# Patient Record
Sex: Female | Born: 1985 | Race: Black or African American | Hispanic: No | Marital: Married | Smoking: Never smoker
Health system: Southern US, Community
[De-identification: ages and names within clinical notes are randomized; demographics above are authoritative.]

## PROBLEM LIST (undated history)

## (undated) ENCOUNTER — Inpatient Hospital Stay (HOSPITAL_COMMUNITY): Payer: Self-pay

## (undated) DIAGNOSIS — Z789 Other specified health status: Secondary | ICD-10-CM

## (undated) HISTORY — PX: NO PAST SURGERIES: SHX2092

---

## 2016-01-04 NOTE — L&D Delivery Note (Signed)
31 y.o. [id number] at 6872w4d delivered a viable female infant in cephalic, LOA position. Anterior shoulder delivered with ease. 60 sec delayed cord clamping. Cord clamped x2 and cut. Placenta delivered spontaneously intact, with 3VC. Fundus firm on exam with massage and pitocin. Good hemostasis noted.  Anesthesia: None Laceration: 1st degree perineal Suture: 2.0 Vicryl Good hemostasis noted. EBL: 250cc  Mom and baby recovering in LDR.    Apgars: APGAR (1 MIN): 9   APGAR (5 MINS): 9     Weight: Pending skin to skin  Sponge and instrument count were correct x2. Placenta sent to L&D.  Howard PouchLauren Feng, MD PGY-2 Family Medicine 07/09/2016, 1:09 AM

## 2016-02-01 ENCOUNTER — Other Ambulatory Visit (HOSPITAL_COMMUNITY): Payer: Self-pay | Admitting: Nurse Practitioner

## 2016-02-01 DIAGNOSIS — Z3689 Encounter for other specified antenatal screening: Secondary | ICD-10-CM

## 2016-02-02 ENCOUNTER — Other Ambulatory Visit: Payer: Self-pay

## 2016-02-12 ENCOUNTER — Ambulatory Visit (HOSPITAL_COMMUNITY)
Admission: RE | Admit: 2016-02-12 | Discharge: 2016-02-12 | Disposition: A | Discharge status: Home / Self Care | Payer: Medicaid Other | Source: Ambulatory Visit | Attending: Nurse Practitioner | Admitting: Nurse Practitioner

## 2016-02-12 ENCOUNTER — Encounter (HOSPITAL_COMMUNITY): Payer: Self-pay

## 2016-02-12 VITALS — BP 118/69 | HR 88 | Wt 170.8 lb

## 2016-02-12 DIAGNOSIS — Z3A19 19 weeks gestation of pregnancy: Secondary | ICD-10-CM | POA: Insufficient documentation

## 2016-02-12 DIAGNOSIS — Z3689 Encounter for other specified antenatal screening: Secondary | ICD-10-CM

## 2016-02-12 DIAGNOSIS — Z0489 Encounter for examination and observation for other specified reasons: Secondary | ICD-10-CM

## 2016-02-12 DIAGNOSIS — Z363 Encounter for antenatal screening for malformations: Secondary | ICD-10-CM | POA: Insufficient documentation

## 2016-02-12 DIAGNOSIS — Z843 Family history of consanguinity: Secondary | ICD-10-CM | POA: Insufficient documentation

## 2016-02-12 DIAGNOSIS — Z315 Encounter for genetic counseling: Secondary | ICD-10-CM | POA: Insufficient documentation

## 2016-02-12 DIAGNOSIS — O352XX0 Maternal care for (suspected) hereditary disease in fetus, not applicable or unspecified: Secondary | ICD-10-CM | POA: Insufficient documentation

## 2016-02-12 DIAGNOSIS — IMO0002 Reserved for concepts with insufficient information to code with codable children: Secondary | ICD-10-CM

## 2016-02-12 HISTORY — DX: Other specified health status: Z78.9

## 2016-02-12 NOTE — Addendum Note (Signed)
Encounter addended by: Heidi DachMelanie A Robb, RN on: 02/12/2016 11:04 AM<BR>    Actions taken: Visit diagnoses modified, Order list changed, Diagnosis association updated

## 2016-02-12 NOTE — Progress Notes (Signed)
Genetic Counseling  Visit Summary Note  Appointment Date: 02/12/2016 Referred By: Jolaine Click  Date of Birth: October 10, 1985  Pregnancy history: G4P3003 Estimated Date of Delivery: 07/05/16 Estimated Gestational Age: [redacted]w[redacted]d  [name] was seen for genetic counseling because of a paternal age of 89  In person English/Arabic translation was provided by a medical interpreter.  In summary:  Discussed paternal age related risks  New dominant gene conditions  Declined Vistara  Aneuploidy  Declined Panorama  Autistic spectrum disorders  Reviewed family history concerns  Couple are first cousins once removed  Declined routine carrier screening  Declined expanded carrier screening  [name] was counseled that advanced paternal age (APA) is defined as paternal age greater than or equal to age 32  Recent large-scale sequencing studies have shown that approximately 80% of de novo point mutations are of paternal origin.  Many studies have demonstrated a strong correlation between increased paternal age and de novo point mutations.  Although no specific data is available regarding fetal risks for fathers 477years old or older at conception, it is apparent that the overall risk for single gene conditions is increased.  It is estimated that the overall chance for a de novo mutation is approximately 1 in 200.  We discussed the wide range of conditions which can be caused by new dominant gene mutations    They were counseled that diagnostic testing for each individual single gene condition is not warranted or available unless ultrasound or concerns lend suspicion to a specific condition. However, there is another NIPS platform ([facility] through [facility] that is able to assess for specific mutations in a panel of 30 selected genes covering 26 conditions. Most of these conditions follow an autosomal dominant pattern of inheritance and typically occur due to de novo gene mutations. The detection  rates for these conditions vary depending upon the specific condition but range from 43% to 96%. Therefore, this screening would not identify all new dominant gene mutations. The lab currently does not accept Medicaid, thus she would have to pay out of pocket for this screen.  She declined screening with Vistara.  She was counseled that some literature suggests that APA is also associated with an increase in risk for fetal aneuploidy.  While other literature does not support this, we discussed that a specific risk for aneuploidy other than that based on maternal age cannot be quantified.  For this reason, we discussed available screening and diagnostic options for fetal aneuploidy.  Specifically, we discussed the options of Quad screening and noninvasive prenatal screening (NIPS).  We discussed that NIPS utilizes cell free placental DNA found in the maternal circulation. This screen is not diagnostic for chromosome conditions, but can provide information regarding the presence or absence of extra fetal DNA for chromosomes 13, 18, 21, X and Y. Thus, it would not identify or rule out all genetic conditions.  After thoughtful consideration, [name] declined NIPS today.    Lastly, we discussed that newer literature suggests that the risk for autism spectrum disorders (ASD) may be increased in children born to fathers of APA.  We discussed that ASDs are among the most common neurodevelopmental disorders, with approximately 1 in 85 children meeting criteria for ASD.  Approximately 80% of individuals diagnosed are female.  There is strong evidence that genetic factors play a critical role in development of ASD.  While there have been recent advances in identifying specific genetic causes of ASD, there are still many individuals for whom the  etiology of the ASD is not known.  She understands that at this time there is no reliable, comprehensive genetic testing available for ASD.     [name]  reported that she  and her husband are related.  Specifically, they are first cousins once removed.  [name]'s maternal grandmother and her husband's father are siblings.  We discussed that children born to a consanguineous couple are at increased risk for genetic conditions.  This increase in risk is related to the possibility of both parents passing on a recessive gene. We explained that every person carries approximately 7-10 non-working genes that when received in a double dose results in a recessive genetic condition.  In general, unrelated couples have a relatively low risk of having a child with a recessive condition because the likelihood of both parents carrying the same non-working recessive gene is very low.  However, when a couple is related, they have inherited some of their genetic information from the same family member, which leads to an increased chance that they may carry the same recessive gene and have a child with a recessive condition.  For first cousin unions, the risk to have a child with a birth defect, mental retardation, or genetic condition is increased approximately 2-4% above the general population risk (3-5%).  We reviewed chromosomes, genes, and recessive inheritance in detail.   There are a myriad of genetic disorders that occur more frequently in specific ethnic groups, those which can be traced to particular geographic locations. We discussed that although these genetic disorders are much more prevalent in specific ethnic groups, as families are becoming increasingly multiracial and multicultural, these conditions can occur in anyone from any race or ethnicity. For this reason, we discussed the availability of ethnic specific genetic carrier screening, professional society (ACOG) recommended carrier testing, and pan-ethnic carrier screening.   We reviewed that ACOG currently recommends that all patients be offered carrier screening for cystic fibrosis, spinal muscular atrophy and  hemoglobinopathies. In addition, she was counseled that there are a variety of genetic screening laboratories that have pan-ethnic, or expanded, carrier screening panels, which evaluate carrier status for a wide range of genetic conditions. Some of these conditions are severe and actionable, but also rare; others occur more commonly, but are less severe. We discussed that testing options range from screening for a single condition to panels of more than 200 autosomal or X-linked genetic conditions. We reviewed that the prevalence of each condition varies (and often varies with ethnicity). Thus the couples' background risk to be a carrier for each of these various conditions would range, and in some cases be very low or unknown. Similarly, the detection rate varies with each condition and also varies in some cases with ethnicity, ranging from greater than 99% (in the case of hemoglobinopathies) to unknown. We reviewed that a negative carrier screen would thus reduce, but not eliminate the chance to be a carrier for these conditions. For some conditions included on specific pan-ethnic carrier screening panels, the pre-test carrier frequency and/or the detection rate is unknown. Thus, for some conditions, the exact reduction of risk with a negative carrier screening result may not be able to be quantified. We reviewed that in the event that one partner is found to be a carrier for one or more conditions, carrier screening would be available to the partner for those conditions. After thoughtful consideration of their options, [name] declined expanded carrier screening today.  Both family histories were reviewed and found to  be noncontributory for birth defects, intellectual disability, and known genetic conditions. Without further information regarding the provided family history, an accurate genetic risk cannot be calculated. Further genetic counseling is warranted if more information is obtained.  I counseled  Lindsey Gilbert, with the assistance of an Arabic interpreter, regarding the above risks and available options. The approximate face-to-face time with the genetic counselor was 45 minutes.  Cam Hai, MS,  Certified Genetic Counselor

## 2016-03-11 ENCOUNTER — Ambulatory Visit (HOSPITAL_COMMUNITY)
Admission: RE | Admit: 2016-03-11 | Discharge: 2016-03-11 | Disposition: A | Discharge status: Home / Self Care | Payer: Medicaid Other | Source: Ambulatory Visit | Attending: Nurse Practitioner | Admitting: Nurse Practitioner

## 2016-03-11 ENCOUNTER — Encounter (HOSPITAL_COMMUNITY): Payer: Self-pay

## 2016-03-11 DIAGNOSIS — Z362 Encounter for other antenatal screening follow-up: Secondary | ICD-10-CM | POA: Insufficient documentation

## 2016-03-11 DIAGNOSIS — IMO0002 Reserved for concepts with insufficient information to code with codable children: Secondary | ICD-10-CM

## 2016-03-11 DIAGNOSIS — Z3A23 23 weeks gestation of pregnancy: Secondary | ICD-10-CM | POA: Insufficient documentation

## 2016-03-11 DIAGNOSIS — Z0489 Encounter for examination and observation for other specified reasons: Secondary | ICD-10-CM

## 2016-07-07 ENCOUNTER — Other Ambulatory Visit (HOSPITAL_COMMUNITY): Payer: Self-pay | Admitting: Nurse Practitioner

## 2016-07-07 DIAGNOSIS — O480 Post-term pregnancy: Secondary | ICD-10-CM

## 2016-07-08 ENCOUNTER — Ambulatory Visit (HOSPITAL_COMMUNITY)
Admission: RE | Admit: 2016-07-08 | Discharge: 2016-07-08 | Disposition: A | Discharge status: Home / Self Care | Payer: Medicaid Other | Source: Ambulatory Visit | Attending: Nurse Practitioner | Admitting: Nurse Practitioner

## 2016-07-08 ENCOUNTER — Inpatient Hospital Stay (HOSPITAL_COMMUNITY)
Admission: AD | Admit: 2016-07-08 | Discharge: 2016-07-11 | DRG: 775 | Disposition: A | Discharge status: Home / Self Care | Payer: Medicaid Other | Source: Ambulatory Visit | Attending: Obstetrics and Gynecology | Admitting: Obstetrics and Gynecology

## 2016-07-08 ENCOUNTER — Other Ambulatory Visit (HOSPITAL_COMMUNITY): Payer: Self-pay | Admitting: Nurse Practitioner

## 2016-07-08 DIAGNOSIS — Z3A40 40 weeks gestation of pregnancy: Secondary | ICD-10-CM

## 2016-07-08 DIAGNOSIS — O480 Post-term pregnancy: Secondary | ICD-10-CM

## 2016-07-08 DIAGNOSIS — Z880 Allergy status to penicillin: Secondary | ICD-10-CM

## 2016-07-08 DIAGNOSIS — Z6791 Unspecified blood type, Rh negative: Secondary | ICD-10-CM

## 2016-07-08 DIAGNOSIS — Z3490 Encounter for supervision of normal pregnancy, unspecified, unspecified trimester: Secondary | ICD-10-CM

## 2016-07-08 DIAGNOSIS — O26893 Other specified pregnancy related conditions, third trimester: Principal | ICD-10-CM | POA: Diagnosis present

## 2016-07-09 ENCOUNTER — Encounter (HOSPITAL_COMMUNITY): Payer: Self-pay | Admitting: Obstetrics and Gynecology

## 2016-07-09 DIAGNOSIS — Z3493 Encounter for supervision of normal pregnancy, unspecified, third trimester: Secondary | ICD-10-CM | POA: Diagnosis present

## 2016-07-09 DIAGNOSIS — O26893 Other specified pregnancy related conditions, third trimester: Secondary | ICD-10-CM | POA: Diagnosis present

## 2016-07-09 DIAGNOSIS — Z6791 Unspecified blood type, Rh negative: Secondary | ICD-10-CM | POA: Diagnosis not present

## 2016-07-09 DIAGNOSIS — Z3490 Encounter for supervision of normal pregnancy, unspecified, unspecified trimester: Secondary | ICD-10-CM

## 2016-07-09 DIAGNOSIS — Z3A40 40 weeks gestation of pregnancy: Secondary | ICD-10-CM

## 2016-07-09 DIAGNOSIS — Z880 Allergy status to penicillin: Secondary | ICD-10-CM | POA: Diagnosis not present

## 2016-07-09 LAB — CBC
HEMATOCRIT: 37.5 % (ref 36.0–46.0)
Hemoglobin: 13 g/dL (ref 12.0–15.0)
MCH: 27.8 pg (ref 26.0–34.0)
MCHC: 34.7 g/dL (ref 30.0–36.0)
MCV: 80.3 fL (ref 78.0–100.0)
Platelets: 171 10*3/uL (ref 150–400)
RBC: 4.67 MIL/uL (ref 3.87–5.11)
RDW: 15 % (ref 11.5–15.5)
WBC: 12.3 10*3/uL — ABNORMAL HIGH (ref 4.0–10.5)

## 2016-07-09 LAB — RPR: RPR Ser Ql: NONREACTIVE

## 2016-07-09 LAB — TYPE AND SCREEN
ABO/RH(D): B NEG
Antibody Screen: NEGATIVE

## 2016-07-09 MED ORDER — ONDANSETRON HCL 4 MG/2ML IJ SOLN: 4.0000 mg | INTRAMUSCULAR | Status: DC | PRN

## 2016-07-09 MED ORDER — COCONUT OIL OIL
1.0000 "application " | TOPICAL_OIL | Status: DC | PRN
  Filled 2016-07-09: qty 120

## 2016-07-09 MED ORDER — OXYCODONE HCL 5 MG PO TABS: 5.0000 mg | ORAL_TABLET | ORAL | Status: DC | PRN

## 2016-07-09 MED ORDER — ONDANSETRON HCL 4 MG/2ML IJ SOLN: 4.0000 mg | Freq: Four times a day (QID) | INTRAMUSCULAR | Status: DC | PRN

## 2016-07-09 MED ORDER — TETANUS-DIPHTH-ACELL PERTUSSIS 5-2.5-18.5 LF-MCG/0.5 IM SUSP: 0.5000 mL | Freq: Once | INTRAMUSCULAR | Status: DC

## 2016-07-09 MED ORDER — PRENATAL MULTIVITAMIN CH
1.0000 | ORAL_TABLET | Freq: Every day | ORAL | Status: DC
  Administered 2016-07-09 – 2016-07-10 (×2): 1 via ORAL
  Filled 2016-07-09 (×3): qty 1

## 2016-07-09 MED ORDER — SIMETHICONE 80 MG PO CHEW: 80.0000 mg | CHEWABLE_TABLET | ORAL | Status: DC | PRN

## 2016-07-09 MED ORDER — WITCH HAZEL-GLYCERIN EX PADS: 1.0000 "application " | MEDICATED_PAD | CUTANEOUS | Status: DC | PRN

## 2016-07-09 MED ORDER — LIDOCAINE HCL (PF) 1 % IJ SOLN
INTRAMUSCULAR | Status: AC
  Administered 2016-07-09: 30 mL
  Filled 2016-07-09: qty 30

## 2016-07-09 MED ORDER — SENNOSIDES-DOCUSATE SODIUM 8.6-50 MG PO TABS
2.0000 | ORAL_TABLET | ORAL | Status: DC
  Administered 2016-07-09 – 2016-07-10 (×2): 2 via ORAL
  Filled 2016-07-09 (×2): qty 2

## 2016-07-09 MED ORDER — OXYCODONE-ACETAMINOPHEN 5-325 MG PO TABS: 2.0000 | ORAL_TABLET | ORAL | Status: DC | PRN

## 2016-07-09 MED ORDER — ACETAMINOPHEN 325 MG PO TABS: 650.0000 mg | ORAL_TABLET | ORAL | Status: DC | PRN

## 2016-07-09 MED ORDER — ZOLPIDEM TARTRATE 5 MG PO TABS: 5.0000 mg | ORAL_TABLET | Freq: Every evening | ORAL | Status: DC | PRN

## 2016-07-09 MED ORDER — LIDOCAINE HCL 1 % IJ SOLN
10.0000 mL | Freq: Once | INTRAMUSCULAR | Status: DC
  Filled 2016-07-09: qty 10

## 2016-07-09 MED ORDER — OXYCODONE-ACETAMINOPHEN 5-325 MG PO TABS
1.0000 | ORAL_TABLET | ORAL | Status: DC | PRN
  Administered 2016-07-09: 1 via ORAL
  Filled 2016-07-09: qty 1

## 2016-07-09 MED ORDER — RHO D IMMUNE GLOBULIN 1500 UNIT/2ML IJ SOSY
300.0000 ug | PREFILLED_SYRINGE | Freq: Once | INTRAMUSCULAR | Status: AC
  Administered 2016-07-09: 300 ug via INTRAMUSCULAR
  Filled 2016-07-09: qty 2

## 2016-07-09 MED ORDER — IBUPROFEN 600 MG PO TABS
600.0000 mg | ORAL_TABLET | Freq: Four times a day (QID) | ORAL | Status: DC
  Administered 2016-07-09 – 2016-07-11 (×9): 600 mg via ORAL
  Filled 2016-07-09 (×10): qty 1

## 2016-07-09 MED ORDER — OXYTOCIN BOLUS FROM INFUSION: 500.0000 mL | Freq: Once | INTRAVENOUS | Status: DC

## 2016-07-09 MED ORDER — LACTATED RINGERS IV SOLN: INTRAVENOUS | Status: DC

## 2016-07-09 MED ORDER — OXYTOCIN 10 UNIT/ML IJ SOLN: 10.0000 [IU] | Freq: Once | INTRAMUSCULAR | Status: DC

## 2016-07-09 MED ORDER — LIDOCAINE HCL (PF) 1 % IJ SOLN
30.0000 mL | INTRAMUSCULAR | Status: DC | PRN
  Filled 2016-07-09: qty 30

## 2016-07-09 MED ORDER — BENZOCAINE-MENTHOL 20-0.5 % EX AERO
1.0000 "application " | INHALATION_SPRAY | CUTANEOUS | Status: DC | PRN
  Administered 2016-07-09: 1 via TOPICAL
  Filled 2016-07-09 (×2): qty 56

## 2016-07-09 MED ORDER — DIBUCAINE 1 % RE OINT
1.0000 "application " | TOPICAL_OINTMENT | RECTAL | Status: DC | PRN
  Filled 2016-07-09: qty 28

## 2016-07-09 MED ORDER — DIPHENHYDRAMINE HCL 25 MG PO CAPS: 25.0000 mg | ORAL_CAPSULE | Freq: Four times a day (QID) | ORAL | Status: DC | PRN

## 2016-07-09 MED ORDER — LACTATED RINGERS IV SOLN: 500.0000 mL | INTRAVENOUS | Status: DC | PRN

## 2016-07-09 MED ORDER — OXYTOCIN 10 UNIT/ML IJ SOLN
INTRAMUSCULAR | Status: AC
  Administered 2016-07-09: 10 [IU]
  Filled 2016-07-09: qty 1

## 2016-07-09 MED ORDER — ONDANSETRON HCL 4 MG PO TABS: 4.0000 mg | ORAL_TABLET | ORAL | Status: DC | PRN

## 2016-07-09 MED ORDER — SOD CITRATE-CITRIC ACID 500-334 MG/5ML PO SOLN: 30.0000 mL | ORAL | Status: DC | PRN

## 2016-07-09 MED ORDER — OXYTOCIN 40 UNITS IN LACTATED RINGERS INFUSION - SIMPLE MED: 2.5000 [IU]/h | INTRAVENOUS | Status: DC

## 2016-07-09 NOTE — MAU Note (Signed)
Leaking fld since 1900 and states is alittle yellow. Ctxs since 2000

## 2016-07-09 NOTE — Progress Notes (Signed)
Barrie DunkerMurayyah Johnson and I were called to assist in delivery in MAU. Arrived in patient's room at 0025 to assist.   Lanier EnsignSavannah Brendle RN

## 2016-07-09 NOTE — MAU Note (Signed)
BS called for bed and no clean bed available. Pt will deliver in MAU

## 2016-07-09 NOTE — Progress Notes (Signed)
Video interpreter for Arabic language was used to explain admission packet including fall precautions for mother and infant safety and security precautions.

## 2016-07-09 NOTE — Progress Notes (Signed)
Dr Mosetta PuttFeng/ Wynelle BourgeoisMarie Williams CNM notified of pt's admission and status. Aware pt complete and no beds in BS and will deliver in MAU

## 2016-07-09 NOTE — H&P (Signed)
LABOR AND DELIVERY ADMISSION HISTORY AND PHYSICAL NOTE  Lindsey Gilbert is a 31 y.o. female [phone] by LMP with IUP at 7845w4d presenting for SOL. She reports +FM, + contractions, No LOF, no VB, no blurry vision, headaches or peripheral edema, and RUQ pain.  She plans on breast feeding.  Patient was admitted and precipitously delivered in MAU. See separate delivery note for details.  Prenatal History/Complications:  Past Medical History: Past Medical History:  Diagnosis Date  . Medical history non-contributory     Past Surgical History: Past Surgical History:  Procedure Laterality Date  . NO PAST SURGERIES      Obstetrical History: OB History    Gravida Para Term Preterm AB Living   4 3 3     3    SAB TAB Ectopic Multiple Live Births                  Social History: Social History   Social History  . Marital status: Married    Spouse name: N/A  . Number of children: N/A  . Years of education: N/A   Social History Main Topics  . Smoking status: Never Smoker  . Smokeless tobacco: Never Used  . Alcohol use No  . Drug use: No  . Sexual activity: Not on file   Other Topics Concern  . Not on file   Social History Narrative  . No narrative on file    Family History: No family history on file.  Allergies: Allergies  Allergen Reactions  . Penicillins     Prescriptions Prior to Admission  Medication Sig Dispense Refill Last Dose  . Prenatal Vit-Fe Fumarate-FA (PRENATAL VITAMIN PO) Take by mouth.   Taking     Review of Systems   All systems reviewed and negative except as stated in HPI  Physical Exam Blood pressure 132/81, pulse 88, temperature 98.7 F (37.1 C), resp. rate 20, height 5' 4.5" (1.638 m), weight 83 kg (183 lb), last menstrual period 09/29/2015. General appearance: alert, cooperative and appears stated age  Prenatal labs: ABO, Rh:  B negative Antibody:  negative Rubella: Immune RPR:   negative HBsAg:   negative HIV:   NR GBS:   negative 1  hr Glucola: WNL Genetic screening:  normal Anatomy US: normal  Prenatal Transfer Tool  Maternal Diabetes: No Genetic Screening: Normal Maternal Ultrasounds/Referrals: Normal Fetal Ultrasounds or other Referrals:  None Maternal Substance Abuse:  No Significant Maternal Medications:  None Significant Maternal Lab Results: Lab values include: Group B Strep negative  No results found for this or any previous visit (from the past 24 hour(s)).  Patient Active Problem List   Diagnosis Date Noted  . [redacted] weeks gestation of pregnancy   . Hereditary disease in family possibly affecting fetus, affecting management of mother, antepartum condition or complication, not applicable or unspecified fetus   . Family history of consanguinity     Assessment: Lindsey Gilbert is a 31 y.o. G4P3003 at 2645w4d here for SOL. Patient was admitted and delivered in the MAU. She will be admitted to mother baby.  - continue normal postpartum care - rhogam  Howard PouchLauren Feng, MD PGY-2 [facility] 07/09/2016, 1:13 AM

## 2016-07-09 NOTE — Lactation Note (Signed)
This note was copied from a baby's chart. Lactation Consultation Note  Patient Name: Boy Dorna MaiBalsm Kerstein UJWJX'BToday's Date: 07/09/2016 Reason for consult: Initial assessment  Visited with this P5 experienced Mom, baby 8311 hrs old.  Baby latches well, latch score 9 given by RN.  Mom denies any problem or discomfort.  Baby given formula (4ml) by bottle.  Recommended she keep baby STS, and feed him at the breast often.  Explained to Mom about importance of breastfeeding to help her milk volume increase.  Mom taught to hand express milk, so encouraged her to do this prior to latching baby. Explained about IP and OP lactation services.  Encouraged Mom to call for assistance prn. Lactation to follow up in am.  Consult Status Consult Status: Follow-up Date: 07/10/16 Follow-up type: In-patient    Judee ClaraSmith, Tryce Surratt E 07/09/2016, 11:52 AM

## 2016-07-10 LAB — RH IG WORKUP (INCLUDES ABO/RH)
ABO/RH(D): B NEG
Fetal Screen: NEGATIVE
GESTATIONAL AGE(WKS): 40.4
UNIT DIVISION: 0

## 2016-07-10 LAB — CBC
HCT: 36 % (ref 36.0–46.0)
Hemoglobin: 12.2 g/dL (ref 12.0–15.0)
MCH: 28 pg (ref 26.0–34.0)
MCHC: 33.9 g/dL (ref 30.0–36.0)
MCV: 82.6 fL (ref 78.0–100.0)
PLATELETS: 152 10*3/uL (ref 150–400)
RBC: 4.36 MIL/uL (ref 3.87–5.11)
RDW: 15.2 % (ref 11.5–15.5)
WBC: 8.6 10*3/uL (ref 4.0–10.5)

## 2016-07-10 MED ORDER — DOCUSATE SODIUM 100 MG PO CAPS: 100.0000 mg | ORAL_CAPSULE | Freq: Two times a day (BID) | ORAL | Status: DC | PRN

## 2016-07-10 MED ORDER — BISACODYL 10 MG RE SUPP
10.0000 mg | Freq: Every day | RECTAL | Status: DC | PRN
  Administered 2016-07-10: 10 mg via RECTAL
  Filled 2016-07-10: qty 1

## 2016-07-10 NOTE — Progress Notes (Signed)
Post Partum Day 1 Subjective: Due to language barrier, an Arabic audio interpreter was present during the history-taking and subsequent discussion (and for part of the physical exam) with this patient.  Patient is up ad lib, voiding, tolerating PO, + flatus and but reports constipation and wants stool softeners.  Baby boy is doing well. Breastfeeding. Declines inpatient circumcision.  Objective: Blood pressure 112/70, pulse 63, temperature 98.7 F (37.1 C), temperature source Oral, resp. rate 18, height 5' 4.5" (1.638 m), weight 183 lb (83 kg), last menstrual period 09/29/2015, SpO2 100 %, unknown if currently breastfeeding.  Physical Exam:  General: alert and no distress Lochia: appropriate Uterine Fundus: firm DVT Evaluation: No evidence of DVT seen on physical exam. Negative Homan's sign.   Recent Labs  07/09/16 0302 07/10/16 0617  HGB 13.0 12.2  HCT 37.5 36.0    Assessment/Plan: Colace and Dulcolax ordered prn Plan for discharge tomorrow and Contraception IUD   LOS: 1 day   Jaynie CollinsUgonna Kainoah Bartosiewicz, MD 07/10/2016, 9:14 AM

## 2016-07-10 NOTE — Lactation Note (Addendum)
This note was copied from a baby's chart. Lactation Consultation Note  Patient Name: Lindsey Gilbert ZOXWR'UToday's Date: 07/10/2016 Reason for consult: Follow-up assessment  Follow up visit at 40 hours of age.  Mom feeding baby now in side lying position with intermittent sucking and chomping.  Lc removed baby from breast and instructed mom on how to as well.  Baby prefers shallow latch and mom doesn't have as much control in this position.  Mom has large everted nipple with compressible tissue.  Baby latched well with wide gape and strong sucking and then tires at breast.  Mom gave baby a bottle of 30mls about 1 hours previous to feeding.  LC instructed mom on waiting for wide open mouth, holding baby close and compressing breasts.  Baby has several swallows, but to sleepy to maintain feeding on his own.  With stimulation baby nursed for about 10 minutes.  Mom reports minimal soreness with latch on  Improved with depth of latch.   LC offered to set up DEBP to increase milk supply due to supplementing with formula and only small smeared stool in 40 hours of life. Mom reports she has not used a pump with older children, but she wants to now to help with milk supply.  LC unsure how well baby is consistently transferring at breast during feedings and encouraged mom to breast feed baby first before supplementing with formula and then post pump on initiate phase.   LC increased flange size to #27 with improved fit and comfort.   Mom to call for assist as needed.     Maternal Data Has patient been taught Hand Expression?: Yes Does the patient have breastfeeding experience prior to this delivery?: Yes  Feeding Feeding Type: Breast Fed Length of feed:  (few minutes)  LATCH Score/Interventions Latch: Repeated attempts needed to sustain latch, nipple held in mouth throughout feeding, stimulation needed to elicit sucking reflex. Intervention(s): Adjust position;Assist with latch;Breast massage;Breast  compression  Audible Swallowing: A few with stimulation Intervention(s): Skin to skin;Hand expression;Alternate breast massage  Type of Nipple: Everted at rest and after stimulation  Comfort (Breast/Nipple): Soft / non-tender  Problem noted: Mild/Moderate discomfort  Hold (Positioning): Assistance needed to correctly position infant at breast and maintain latch. Intervention(s): Breastfeeding basics reviewed;Position options  LATCH Score: 7  Lactation Tools Discussed/Used WIC Program: Yes Pump Review: Setup, frequency, and cleaning;Milk Storage Initiated by:: JS IBCLC Date initiated:: 07/10/16   Consult Status Consult Status: Follow-up Date: 07/11/16 Follow-up type: In-patient    Jannifer RodneyShoptaw, Jana Lynn 07/10/2016, 4:52 PM

## 2016-07-11 MED ORDER — IBUPROFEN 600 MG PO TABS: 600.0000 mg | ORAL_TABLET | Freq: Four times a day (QID) | ORAL | 0 refills | Status: DC | PRN

## 2016-07-11 NOTE — Discharge Summary (Signed)
OB Discharge Summary     Patient Name: Lindsey Gilbert DOB: March 18, 1985 MRN: [id number]  Date of admission: 07/08/2016 Delivering MD: Howard Pouch   Date of discharge: 07/11/2016  Admitting diagnosis: 40wks contractions water broke Intrauterine pregnancy: [redacted]w[redacted]d     Secondary diagnosis:  Active Problems:   Pregnant   Normal labor and delivery  Additional problems: Rh negative     Discharge diagnosis: Term Pregnancy Delivered                                                                                                Post partum procedures:rhogam  Augmentation: N/A  Complications: None  Hospital course:  Onset of Labor With Vaginal Delivery     31 y.o. yo [id number] at [redacted]w[redacted]d was admitted in Active Labor on 07/08/2016. Patient had an uncomplicated labor course as follows:  Membrane Rupture Time/Date:   ,    Intrapartum Procedures: Episiotomy: None [1]                                         Lacerations:  1st degree [2]  Patient had a delivery of a Viable infant. 07/09/2016  Information for the patient's newborn:  Lindsey Gilbert [[id number]]  Delivery Method: Vaginal, Spontaneous Delivery (Filed from Delivery Summary)    Pateint had an uncomplicated postpartum course.  She is ambulating, tolerating a regular diet, passing flatus, and urinating well. Patient is discharged home in stable condition on 07/11/16.   Physical exam  Vitals:   07/09/16 1831 07/10/16 0615 07/10/16 1910 07/11/16 0529  BP: 114/64 112/70 99/60 116/73  Pulse: 64 63 (!) 59 69  Resp: 18 18 16 16   Temp: 98.3 F (36.8 C) 98.7 F (37.1 C) 98.2 F (36.8 C) 98.2 F (36.8 C)  TempSrc: Oral Oral Oral Oral  SpO2:      Weight:      Height:       General: alert and cooperative Lochia: appropriate Uterine Fundus: firm Incision: N/A DVT Evaluation: No evidence of DVT seen on physical exam. Labs: Lab Results  Component Value Date   WBC 8.6 07/10/2016   HGB 12.2 07/10/2016   HCT 36.0 07/10/2016   MCV  82.6 07/10/2016   PLT 152 07/10/2016   No flowsheet data found.  Discharge instruction: per After Visit Summary and "Baby and Me Booklet".  After visit meds:  Allergies as of 07/11/2016      Reactions   Penicillins Rash   Has patient had a PCN reaction causing immediate rash, facial/tongue/throat swelling, SOB or lightheadedness with hypotension: Yes Has patient had a PCN reaction causing severe rash involving mucus membranes or skin necrosis: No Has patient had a PCN reaction that required hospitalization: No Has patient had a PCN reaction occurring within the last 10 years: No If all of the above answers are "NO", then may proceed with Cephalosporin use.      Medication List    TAKE these medications   ibuprofen 600 MG tablet Commonly known as:  ADVIL,MOTRIN Take 1 tablet (600 mg total) by mouth every 6 (six) hours as needed.   PRENATAL VITAMIN PO Take 1 tablet by mouth daily.       Diet: routine diet  Activity: Advance as tolerated. Pelvic rest for 6 weeks.   Outpatient follow up:6 weeks Follow up Appt:No future appointments. Follow up Visit:No Follow-up on file.  Postpartum contraception: IUD Mirena  Newborn Data: Live born female  Birth Weight: 8 lb 0.4 oz (3640 g) APGAR: 9, 9  Baby Feeding: Breast Disposition:home with mother   07/11/2016 [name], Lindsey Canion, CNM  7:16 AM

## 2016-07-11 NOTE — Lactation Note (Signed)
This note was copied from a baby's chart. Lactation Consultation Note  [place] video interpreter [phone] use for Arabic. Mother states baby latches well but is giving lots of formula. She states she is also pumping and getting breastmilk. Discussed supply and demand and encouraged mother to breastfeed before offering formula to help establish her milk supply. Mom encouraged to feed baby 8-12 times/24 hours and with feeding cues.  Reviewed engorgement care and monitoring voids/stools. Provided mother with hand pump and demonstrated use.   Patient Name: Lindsey Gilbert EAVWU'JToday's Date: 07/11/2016 Reason for consult: Follow-up assessment   Maternal Data    Feeding    LATCH Score/Interventions                      Lactation Tools Discussed/Used     Consult Status Consult Status: Complete    Hardie PulleyBerkelhammer, Ruth Boschen 07/11/2016, 10:28 AM

## 2016-07-11 NOTE — Discharge Instructions (Signed)

## 2016-07-11 NOTE — Progress Notes (Signed)
UR chart review completed.  

## 2016-07-12 ENCOUNTER — Inpatient Hospital Stay (HOSPITAL_COMMUNITY): Payer: Medicaid Other

## 2017-01-03 NOTE — L&D Delivery Note (Addendum)
Delivery Note Lindsey Gilbert is a 32 y.o. female G5P4004 with IUP at 6727w2d admitted for SOL.  She arrived to MAU complete and pushing without augmentation.  She pushed twice to deliver.  Cord clamping delayed by several minutes then clamped by S-CNM and cut by FOB.  At 9:19 AM a viable female was delivered via Vaginal, Spontaneous (Presentation: vertex, ROA).  APGAR: 7, 9; weight pending.   Placenta status: spontaneous, intact. Brisk bleeding noted, pitocin and methergine given. Cord: 3-vessel, intact, with two true knots and no complications. Cord pH: n/a  Anesthesia: Local Episiotomy: None Lacerations: 2nd degree Suture Repair: 2.0 vicryl rapide Est. Blood Loss (mL): 600   Mom to postpartum.  Baby to Couplet care / Skin to Skin.  Bernerd LimboJamilla R Gilbert 11/15/2017, 10:10 AM  Midwife attestation: I was gloved and present for delivery in its entirety and I agree with the above resident's note.  Donette LarryMelanie Everlene Gilbert, CNM 10:27 AM

## 2017-06-08 ENCOUNTER — Other Ambulatory Visit (HOSPITAL_COMMUNITY): Payer: Self-pay | Admitting: Nurse Practitioner

## 2017-06-08 DIAGNOSIS — Z3A19 19 weeks gestation of pregnancy: Secondary | ICD-10-CM

## 2017-06-08 DIAGNOSIS — Z36 Encounter for antenatal screening for chromosomal anomalies: Secondary | ICD-10-CM

## 2017-06-08 DIAGNOSIS — Z3689 Encounter for other specified antenatal screening: Secondary | ICD-10-CM

## 2017-06-14 ENCOUNTER — Encounter (HOSPITAL_COMMUNITY): Payer: Self-pay | Admitting: *Deleted

## 2017-06-15 ENCOUNTER — Encounter (HOSPITAL_COMMUNITY): Payer: Self-pay

## 2017-06-15 ENCOUNTER — Ambulatory Visit (HOSPITAL_COMMUNITY)
Admission: RE | Admit: 2017-06-15 | Discharge: 2017-06-15 | Disposition: A | Discharge status: Home / Self Care | Payer: Medicaid Other | Source: Ambulatory Visit | Attending: Nurse Practitioner | Admitting: Nurse Practitioner

## 2017-06-15 ENCOUNTER — Other Ambulatory Visit (HOSPITAL_COMMUNITY): Payer: Self-pay | Admitting: Nurse Practitioner

## 2017-06-15 ENCOUNTER — Other Ambulatory Visit (HOSPITAL_COMMUNITY): Payer: Self-pay | Admitting: *Deleted

## 2017-06-15 DIAGNOSIS — Z3A19 19 weeks gestation of pregnancy: Secondary | ICD-10-CM | POA: Diagnosis not present

## 2017-06-15 DIAGNOSIS — Z3689 Encounter for other specified antenatal screening: Secondary | ICD-10-CM | POA: Diagnosis not present

## 2017-06-15 DIAGNOSIS — Z315 Encounter for genetic counseling: Secondary | ICD-10-CM | POA: Diagnosis not present

## 2017-06-15 DIAGNOSIS — Z843 Family history of consanguinity: Secondary | ICD-10-CM | POA: Diagnosis not present

## 2017-06-15 DIAGNOSIS — O09892 Supervision of other high risk pregnancies, second trimester: Secondary | ICD-10-CM

## 2017-06-15 DIAGNOSIS — O09529 Supervision of elderly multigravida, unspecified trimester: Secondary | ICD-10-CM

## 2017-06-15 DIAGNOSIS — Z36 Encounter for antenatal screening for chromosomal anomalies: Secondary | ICD-10-CM

## 2017-06-15 NOTE — Progress Notes (Signed)
Genetic Counseling  High-Risk Gestation Note  Appointment Date:  06/15/2017 Referred By: Jolaine Click, NP Date of Birth:  [birth year] Partner:  Francie Massing   Pregnancy History: [id number] Estimated Date of Delivery: 11/13/17 Estimated Gestational Age: 46w3dAttending: MRenella Cunas MD  I met with [name] for genetic counseling because of a family history of consanguinity and advanced paternal age for the father of the pregnancy. [name] was seen in her previous pregnancy on 02/12/16 for genetic counseling. AUticawas present and provided interpretation for today's visit.   In summary:  Discussed family history of consanguinity  Increased risk for autosomal recessive conditions in offspring  Risk for couple to be carrier couple is reduced given their four previous unaffected children  Discussed option of pan-ethnic expanded carrier screening- declined  Reviewed advanced paternal age and associated risk for de novo single gene conditions  Discussed options of screening / testing  Detailed ultrasound- performed today, see separate report  Single gene NIPT ([facility])- patient declined  We began by updating the family history in detail from the patient's 2018 visit. Updates were noncontributory for birth defects, intellectual disability, and known genetic conditions. The couple are [place] and [name] reported that their grandparents are siblings; her maternal grandmother and her husband's maternal grandfather were siblings, meaning the couple are second cousins to each other.   We discussed that children born to a consanguineous couple are at increased risk for genetic conditions.  This increase in risk is related to the possibility of both parents passing on a recessive gene. We explained that every person carries approximately 7-10 non-working genes that when received in a double dose results in a recessive genetic condition.  In general,  unrelated couples have a relatively low risk of having a child with a recessive condition because the likelihood of both parents carrying the same non-working recessive gene is very low.  However, when a couple is related, they have inherited some of their genetic information from the same family member, which leads to an increased chance that they may carry the same recessive gene and have a child with a recessive condition.  For second cousin unions, the risk to have a child with a birth defect, intellectual disability, or genetic condition is increased approximately 1% above the general population risk of 3-5%.  We reviewed chromosomes, genes, and recessive inheritance in detail.   There are a myriad of genetic disorders that occur more frequently in specific ethnic groups, those which can be traced to particular geographic locations. We discussed that although these genetic disorders are much more prevalent in specific ethnic groups, as families are becoming increasingly multiracial and multicultural, these conditions can occur in anyone from any race or ethnicity. For this reason, we discussed the availability of ethnic specific genetic carrier screening, professional society (ACOG) recommended carrier testing, and pan-ethnic carrier screening.   We reviewed that ACOG currently recommends that all patients be offered carrier screening for cystic fibrosis, spinal muscular atrophy and hemoglobinopathies. In addition, they were counseled that there are a variety of genetic screening laboratories that have pan-ethnic, or expanded, carrier screening panels, which evaluate carrier status for a wide range of genetic conditions. Some of these conditions are severe and actionable, but also rare; others occur more commonly, but are less severe. We discussed that testing options range from screening for a single condition to panels of more than 200 autosomal or X-linked genetic conditions. We reviewed that the  prevalence of each  condition varies (and often varies with ethnicity). Thus the couples' background risk to be a carrier for each of these various conditions would range, and in some cases be very low or unknown. Similarly, the detection rate varies with each condition and also varies in some cases with ethnicity, ranging from greater than 99% (in the case of hemoglobinopathies) to unknown. We reviewed that a negative carrier screen would thus reduce, but not eliminate the chance to be a carrier for these conditions. For some conditions included on specific pan-ethnic carrier screening panels, the pre-test carrier frequency and/or the detection rate is unknown. Thus, for some conditions, the exact reduction of risk with a negative carrier screening result may not be able to be quantified. We reviewed that in the event that one partner is found to be a carrier for one or more conditions, carrier screening would be available to the partner for those conditions. After thoughtful consideration of their options, [name] declined carrier screening today.   The father of the pregnancy is 36 years old. We reviewed that advanced paternal age (APA) is defined as paternal age greater than or equal to age 2.  Recent large-scale sequencing studies have shown that approximately 80% of de novo point mutations are of paternal origin.  Many studies have demonstrated a strong correlation between increased paternal age and de novo point mutations.  Although no specific data is available regarding fetal risks for fathers 73+ years old at conception, it is apparent that the overall risk for single gene conditions is increased.  To estimate the relative increase in risk of a genetic disorder with APA, the heritability of the disease must be considered.  Assuming an approximate 2x increase in risk for conditions that are exclusively paternal in origin, the risk for each individual condition is still relatively low.  It is  estimated that the overall chance for a de novo mutation is ~0.5%.  We also discussed the wide range of conditions which can be caused by new dominant gene mutations (achondroplasia, neurofibromatosis, Marfan syndrome etc.).      She was counseled that diagnostic testing for each individual single gene condition is not warranted or available unless ultrasound or concerns lend suspicion to a specific condition. However, there is another NIPS platform ([facility] through [facility]) that is able to assess for specific mutations in a panel of 30 selected genes covering 26 conditions. Most of these conditions follow an autosomal dominant pattern of inheritance and typically occur due to de novo gene mutations. The detection rates for these conditions vary depending upon the specific condition but range from 43% to 96%. Therefore, this screening would not identify all new dominant gene mutations. She declined additional screening with [facility] (single gene NIPS).  In addition, we discussed the recommendation for a detailed ultrasound at 18+ weeks gestation and a follow up ultrasound at ~28 weeks to monitor fetal growth. Detailed ultrasound was performed today. Visualized fetal anatomy was within normal limits. Complete ultrasound results under separate cover. Follow-up ultrasound was [facility] to complete fetal anatomic survey. [name] understands that ultrasound cannot diagnose or rule out all birth defects or genetic conditions.   [name] denied exposure to environmental toxins or chemical agents. She denied the use of alcohol, tobacco or street drugs. She denied significant viral illnesses during the course of her pregnancy.  I counseled [name] regarding the above risks and available options.  The approximate face-to-face time  with the genetic counselor was 20 minutes.  Chipper Oman, MS Certified Genetic Counselor 06/15/2017

## 2017-07-14 ENCOUNTER — Ambulatory Visit (HOSPITAL_COMMUNITY)
Admission: RE | Admit: 2017-07-14 | Discharge: 2017-07-14 | Disposition: A | Discharge status: Home / Self Care | Payer: Medicaid Other | Source: Ambulatory Visit | Attending: Nurse Practitioner | Admitting: Nurse Practitioner

## 2017-07-14 ENCOUNTER — Other Ambulatory Visit (HOSPITAL_COMMUNITY): Payer: Self-pay | Admitting: *Deleted

## 2017-07-14 ENCOUNTER — Other Ambulatory Visit (HOSPITAL_COMMUNITY): Payer: Self-pay | Admitting: Maternal and Fetal Medicine

## 2017-07-14 ENCOUNTER — Encounter (HOSPITAL_COMMUNITY): Payer: Self-pay

## 2017-07-14 DIAGNOSIS — Z362 Encounter for other antenatal screening follow-up: Secondary | ICD-10-CM

## 2017-07-14 DIAGNOSIS — Z3A22 22 weeks gestation of pregnancy: Secondary | ICD-10-CM | POA: Diagnosis not present

## 2017-07-14 DIAGNOSIS — O09522 Supervision of elderly multigravida, second trimester: Secondary | ICD-10-CM | POA: Diagnosis not present

## 2017-07-14 DIAGNOSIS — O09529 Supervision of elderly multigravida, unspecified trimester: Secondary | ICD-10-CM

## 2017-07-14 DIAGNOSIS — Z0489 Encounter for examination and observation for other specified reasons: Secondary | ICD-10-CM | POA: Diagnosis not present

## 2017-07-14 DIAGNOSIS — Z843 Family history of consanguinity: Secondary | ICD-10-CM

## 2017-07-14 DIAGNOSIS — IMO0002 Reserved for concepts with insufficient information to code with codable children: Secondary | ICD-10-CM

## 2017-08-25 ENCOUNTER — Ambulatory Visit (HOSPITAL_COMMUNITY)
Admission: RE | Admit: 2017-08-25 | Discharge: 2017-08-25 | Disposition: A | Discharge status: Home / Self Care | Payer: Medicaid Other | Source: Ambulatory Visit | Attending: Nurse Practitioner | Admitting: Nurse Practitioner

## 2017-08-25 ENCOUNTER — Other Ambulatory Visit (HOSPITAL_COMMUNITY): Payer: Self-pay | Admitting: *Deleted

## 2017-08-25 ENCOUNTER — Encounter (HOSPITAL_COMMUNITY): Payer: Self-pay

## 2017-08-25 DIAGNOSIS — Z843 Family history of consanguinity: Secondary | ICD-10-CM | POA: Insufficient documentation

## 2017-08-25 DIAGNOSIS — Z315 Encounter for genetic counseling: Secondary | ICD-10-CM | POA: Diagnosis not present

## 2017-08-25 DIAGNOSIS — O09892 Supervision of other high risk pregnancies, second trimester: Secondary | ICD-10-CM

## 2017-08-25 DIAGNOSIS — Z3A28 28 weeks gestation of pregnancy: Secondary | ICD-10-CM

## 2017-08-25 DIAGNOSIS — Z362 Encounter for other antenatal screening follow-up: Secondary | ICD-10-CM | POA: Diagnosis present

## 2017-09-22 ENCOUNTER — Encounter (HOSPITAL_COMMUNITY): Payer: Self-pay

## 2017-09-22 ENCOUNTER — Ambulatory Visit (HOSPITAL_COMMUNITY)
Admission: RE | Admit: 2017-09-22 | Discharge: 2017-09-22 | Disposition: A | Discharge status: Home / Self Care | Payer: BLUE CROSS/BLUE SHIELD | Source: Ambulatory Visit | Attending: Nurse Practitioner | Admitting: Nurse Practitioner

## 2017-09-22 DIAGNOSIS — Z843 Family history of consanguinity: Secondary | ICD-10-CM | POA: Diagnosis present

## 2017-09-22 DIAGNOSIS — Z3A32 32 weeks gestation of pregnancy: Secondary | ICD-10-CM | POA: Diagnosis not present

## 2017-09-22 DIAGNOSIS — Z362 Encounter for other antenatal screening follow-up: Secondary | ICD-10-CM | POA: Diagnosis not present

## 2017-09-22 DIAGNOSIS — O09893 Supervision of other high risk pregnancies, third trimester: Secondary | ICD-10-CM

## 2017-09-22 DIAGNOSIS — Z315 Encounter for genetic counseling: Secondary | ICD-10-CM

## 2017-10-20 ENCOUNTER — Other Ambulatory Visit: Payer: Self-pay

## 2017-10-20 ENCOUNTER — Inpatient Hospital Stay (HOSPITAL_COMMUNITY)
Admission: AD | Admit: 2017-10-20 | Discharge: 2017-10-21 | Disposition: A | Discharge status: Home / Self Care | Payer: BLUE CROSS/BLUE SHIELD | Source: Ambulatory Visit | Attending: Obstetrics and Gynecology | Admitting: Obstetrics and Gynecology

## 2017-10-20 ENCOUNTER — Encounter (HOSPITAL_COMMUNITY): Payer: Self-pay | Admitting: *Deleted

## 2017-10-20 DIAGNOSIS — O26893 Other specified pregnancy related conditions, third trimester: Secondary | ICD-10-CM | POA: Diagnosis present

## 2017-10-20 DIAGNOSIS — Z880 Allergy status to penicillin: Secondary | ICD-10-CM | POA: Insufficient documentation

## 2017-10-20 DIAGNOSIS — L299 Pruritus, unspecified: Secondary | ICD-10-CM | POA: Diagnosis not present

## 2017-10-20 DIAGNOSIS — Z3A36 36 weeks gestation of pregnancy: Secondary | ICD-10-CM | POA: Diagnosis not present

## 2017-10-20 DIAGNOSIS — O9989 Other specified diseases and conditions complicating pregnancy, childbirth and the puerperium: Secondary | ICD-10-CM

## 2017-10-20 DIAGNOSIS — Z843 Family history of consanguinity: Secondary | ICD-10-CM

## 2017-10-20 LAB — COMPREHENSIVE METABOLIC PANEL
ALBUMIN: 3.2 g/dL — AB (ref 3.5–5.0)
ALT: 32 U/L (ref 0–44)
AST: 28 U/L (ref 15–41)
Alkaline Phosphatase: 81 U/L (ref 38–126)
Anion gap: 7 (ref 5–15)
BILIRUBIN TOTAL: 0.3 mg/dL (ref 0.3–1.2)
BUN: 7 mg/dL (ref 6–20)
CHLORIDE: 104 mmol/L (ref 98–111)
CO2: 23 mmol/L (ref 22–32)
CREATININE: 0.52 mg/dL (ref 0.44–1.00)
Calcium: 8.4 mg/dL — ABNORMAL LOW (ref 8.9–10.3)
GFR calc Af Amer: >60 mL/min (ref >60)
GFR calc non Af Amer: >60 mL/min (ref >60)
GLUCOSE: 88 mg/dL (ref 70–99)
POTASSIUM: 3.7 mmol/L (ref 3.5–5.1)
Sodium: 134 mmol/L — ABNORMAL LOW (ref 135–145)
Total Protein: 6.5 g/dL (ref 6.5–8.1)

## 2017-10-20 MED ORDER — HYDROXYZINE HCL 25 MG PO TABS
25.0000 mg | ORAL_TABLET | Freq: Once | ORAL | Status: AC | PRN
  Administered 2017-10-20: 25 mg via ORAL
  Filled 2017-10-20: qty 1

## 2017-10-20 NOTE — MAU Note (Signed)
Was seen health dept yesterday am for itching eyes and lotion for legs and arms for itching. Was called today by health dept and told to stop meds and come here but they do not know why. Denies LOF or bleeding. No pain

## 2017-10-20 NOTE — MAU Provider Note (Signed)
History     CSN: [id number]  Arrival date and time: 10/20/17 2049   None     Chief Complaint  Patient presents with  . Pruritis   HPI   Lindsey Gilbert is a 32 y.o. [id number] female at [redacted]w[redacted]d who presents to MAU with pruritis x2 months. Was seen at [facility] yesterday and noted itching. However, labs were not obtained. She was prescribed Hydralazine instead of Hydroxyzine. She was called and told to d/c the medication and to come to MAU for labs.   She reports that the itching is worst on her legs and face. She has itchy eyes as well. Reports she has never had a rash or skin changes associated with her itching.   OB History    Gravida  5   Para  4   Term  4   Preterm      AB      Living  4     SAB      TAB      Ectopic      Multiple  0   Live Births  1           Past Medical History:  Diagnosis Date  . Medical history non-contributory     Past Surgical History:  Procedure Laterality Date  . NO PAST SURGERIES      History reviewed. No pertinent family history.  Social History   Tobacco Use  . Smoking status: Never Smoker  . Smokeless tobacco: Never Used  Substance Use Topics  . Alcohol use: No  . Drug use: No    Allergies:  Allergies  Allergen Reactions  . Penicillins Rash    Has patient had a PCN reaction causing immediate rash, facial/tongue/throat swelling, SOB or lightheadedness with hypotension: Yes Has patient had a PCN reaction causing severe rash involving mucus membranes or skin necrosis: No Has patient had a PCN reaction that required hospitalization: No Has patient had a PCN reaction occurring within the last 10 years: No If all of the above answers are "NO", then may proceed with Cephalosporin use.    Medications Prior to Admission  Medication Sig Dispense Refill Last Dose  . Prenatal Vit-Fe Fumarate-FA (PRENATAL VITAMIN PO) Take 1 tablet by mouth daily.    10/20/2017 at Unknown time  . ibuprofen (ADVIL,MOTRIN) 600 MG tablet Take 1  tablet (600 mg total) by mouth every 6 (six) hours as needed. (Patient not taking: Reported on 06/15/2017) 30 tablet 0 Not Taking    Review of Systems  Constitutional: Negative for activity change, chills and fever.  Respiratory: Negative for shortness of breath.   Cardiovascular: Negative for chest pain.  Gastrointestinal: Negative for abdominal pain.  Skin: Negative for color change, pallor and rash.   Physical Exam   Blood pressure 125/68, pulse 67, temperature 98.2 F (36.8 C), resp. rate 18, height 5\' 5"  (1.651 m), weight 80.3 kg, last menstrual period 01/27/2017, unknown if currently breastfeeding.  Physical Exam  Nursing note and vitals reviewed. Constitutional: She is oriented to person, place, and time. She appears well-developed and well-nourished. No distress.  HENT:  Head: Normocephalic and atraumatic.  Eyes: Conjunctivae and EOM are normal. No scleral icterus.  Neck: Normal range of motion. Neck supple.  Cardiovascular: Normal rate, regular rhythm and normal heart sounds.  Respiratory: Effort normal and breath sounds normal. No respiratory distress.  GI: Soft. Bowel sounds are normal. She exhibits no distension. There is no tenderness. There is no rebound and no guarding.  Musculoskeletal:  Normal range of motion. She exhibits no edema.  Neurological: She is alert and oriented to person, place, and time.  Skin: Skin is warm and dry. No rash noted. She is not diaphoretic. No erythema.  Psychiatric: She has a normal mood and affect. Her behavior is normal.   FHT: 120 bpm, moderate variability, +acels, no decels  Toco: Occasional   MAU Course  Procedures  MDM Patient presenting with diffuse itching x2 months. CMET obtained and LFTs within normal limits. NST reactive. Bile acids sent.   Assessment and Plan   1. Itching   2. Family history of consanguinity   3. [redacted] weeks gestation of pregnancy    Most concerned for cholestasis of pregnancy. Unlikely to be PUPPs given  lack of rash associated with pruritis. Bile acids are pending. Given that it is a send out lab, will need to call patient with results and further plan of management if elevated. Prescribed atarax for itching. Patient requested eye drops, prescribed Pataday.   De Hollingshead 10/20/2017, 11:54 PM

## 2017-10-20 NOTE — MAU Note (Signed)
Pt reports itching is improved

## 2017-10-21 MED ORDER — OLOPATADINE HCL 0.2 % OP SOLN: 1.0000 [drp] | Freq: Two times a day (BID) | OPHTHALMIC | 0 refills | Status: DC | PRN

## 2017-10-21 MED ORDER — HYDROXYZINE HCL 10 MG PO TABS: 10.0000 mg | ORAL_TABLET | Freq: Three times a day (TID) | ORAL | 0 refills | Status: DC | PRN

## 2017-10-21 NOTE — Discharge Instructions (Signed)
Your itching is worrisome for cholestasis of pregnancy. We need to check your bile acid level to see if that is what is causing the itching. This lab needs to be sent out and will likely result on Monday. We will call you with the results. I have prescribed a medication and an eye drop to help with the itching over the weekend.   Cholestasis of Pregnancy Cholestasis refers to any condition that causes the flow of digestive fluid (bile) produced by the liver to slow down or stop. Cholestasis of pregnancy is most common toward the end of pregnancy (thirdtrimester), but it can occur any time during pregnancy. The condition often goes away soon after giving birth. Cholestasis may be uncomfortable, but it is usually harmless to you. However, it can be harmful to your baby. Cholestasis may increase the risk of:  Your baby being born too early (preterm delivery).  Your baby having a slow heart rate and lack of oxygen during delivery (fetal distress).  Losing your baby before delivery (stillbirth).  What are the causes? The exact cause of this condition is not known, but it may be related to:  Pregnancy hormones. The gallbladder normally holds bile until you need it to help digest fat in your diet. Pregnancy hormones may cause the flow of bile to slow down and back up into your liver. Bile may then get into your bloodstream and cause cholestasis symptoms.  Changes in your genes (genetic mutations). Specifically, genes that affect how the liver releases bile.  What increases the risk? You are more likely to develop this condition if:  You had cholestasis during a previous pregnancy.  You have a family history of cholestasis.  You have liver problems.  You are having multiple babies, such as twins or triplets.  What are the signs or symptoms? The most common symptom of this condition is intense itching (pruritus), especially on the palms of your hands and soles of your feet. The itching can  spread to the rest of your body and is often worse at night. You will not usually have a rash. Other symptoms may include:  Feeling tired.  Pain in your upper right abdomen.  Dark-colored urine.  Light-colored stools.  Poor appetite.  Yellowish discoloration of your skin and the whites of your eyes (jaundice).  How is this diagnosed? This condition is diagnosed based on:  Your medical history.  A physical exam.  Blood tests.  If you have an inherited risk for developing this condition, you may also have genetic testing. How is this treated? The goal of treatment is to make you comfortable and keep your baby safe. Your health care provider may:  Prescribe medicine to reduce bile acid in your bloodstream, relieve symptoms, and help keep your baby safe.  Give you vitamin K before delivery to prevent excessive bleeding.  Check your baby frequently (fetal monitoring).  Perform regular blood tests to check your bile levels and liver function until your baby is delivered.  Recommend starting (inducing) your labor and delivery by week 36 or 37 of pregnancy, or as soon as your baby's lungs have developed enough.  Follow these instructions at home:  Take over-the-counter and prescription medicines only as told by your health care provider.  Take cool baths to soothe itchy skin.  Wear comfortable, loose-fitting, cotton clothing to reduce itching.  Keep your fingernails short to prevent skin irritation from scratching.  Keep all follow-up visits and prenatal visits as told by your health care provider. This  is important. Contact a health care provider if:  Your symptoms get worse, even with treatment.  You develop pain in your right side.  You have unusual swelling in your abdomen, feet, ankles, or legs.  You have a fever.  You are more thirsty than usual. Get help right away if:  You go into early labor.  You have a headache that does not go away or causes changes  in vision.  You have nausea or you vomit.  You have severe pain in your abdomen or shoulders.  You have shortness of breath. Summary  Cholestasis of pregnancy is most common toward the end of pregnancy (thirdtrimester), but it can occur any time during your pregnancy.  The condition often goes away soon after your baby is born.  The most common symptom of cholestasis of pregnancy is intense itching (pruritus), especially on the palms of your hands and soles of your feet.  This condition may be treated with medicine, frequent monitoring, or starting (inducing) labor and delivery by week 36 or 37 of pregnancy. This information is not intended to replace advice given to you by your health care provider. Make sure you discuss any questions you have with your health care provider. Document Released: 12/18/1999 Document Revised: 12/05/2015 Document Reviewed: 12/05/2015 Elsevier Interactive Patient Education  2017 [employer].

## 2017-10-23 LAB — BILE ACIDS, TOTAL: BILE ACIDS TOTAL: 6 umol/L (ref 0.0–10.0)

## 2017-11-15 ENCOUNTER — Inpatient Hospital Stay (HOSPITAL_COMMUNITY)
Admission: AD | Admit: 2017-11-15 | Discharge: 2017-11-17 | DRG: 807 | Disposition: A | Discharge status: Home / Self Care | Payer: BLUE CROSS/BLUE SHIELD | Attending: Obstetrics & Gynecology | Admitting: Obstetrics & Gynecology

## 2017-11-15 ENCOUNTER — Encounter (HOSPITAL_COMMUNITY): Payer: Self-pay

## 2017-11-15 ENCOUNTER — Inpatient Hospital Stay (HOSPITAL_COMMUNITY)
Admission: AD | Admit: 2017-11-15 | Discharge: 2017-11-15 | Disposition: A | Discharge status: Home / Self Care | Payer: BLUE CROSS/BLUE SHIELD | Source: Ambulatory Visit | Attending: Family Medicine | Admitting: Family Medicine

## 2017-11-15 DIAGNOSIS — O480 Post-term pregnancy: Secondary | ICD-10-CM | POA: Diagnosis not present

## 2017-11-15 DIAGNOSIS — Z6791 Unspecified blood type, Rh negative: Secondary | ICD-10-CM | POA: Diagnosis not present

## 2017-11-15 DIAGNOSIS — Z3483 Encounter for supervision of other normal pregnancy, third trimester: Secondary | ICD-10-CM | POA: Diagnosis present

## 2017-11-15 DIAGNOSIS — O479 False labor, unspecified: Secondary | ICD-10-CM

## 2017-11-15 DIAGNOSIS — O471 False labor at or after 37 completed weeks of gestation: Secondary | ICD-10-CM | POA: Insufficient documentation

## 2017-11-15 DIAGNOSIS — Z880 Allergy status to penicillin: Secondary | ICD-10-CM | POA: Diagnosis not present

## 2017-11-15 DIAGNOSIS — Z3A40 40 weeks gestation of pregnancy: Secondary | ICD-10-CM

## 2017-11-15 DIAGNOSIS — O26893 Other specified pregnancy related conditions, third trimester: Secondary | ICD-10-CM | POA: Diagnosis present

## 2017-11-15 LAB — CBC
HCT: 42.3 % (ref 36.0–46.0)
Hemoglobin: 14.2 g/dL (ref 12.0–15.0)
MCH: 27.8 pg (ref 26.0–34.0)
MCHC: 33.6 g/dL (ref 30.0–36.0)
MCV: 82.9 fL (ref 80.0–100.0)
NRBC: 0 % (ref 0.0–0.2)
Platelets: 190 10*3/uL (ref 150–400)
RBC: 5.1 MIL/uL (ref 3.87–5.11)
RDW: 14.7 % (ref 11.5–15.5)
WBC: 9.9 10*3/uL (ref 4.0–10.5)

## 2017-11-15 MED ORDER — PROMETHAZINE HCL 25 MG/ML IJ SOLN
25.0000 mg | Freq: Four times a day (QID) | INTRAMUSCULAR | Status: DC | PRN
Start: 2017-11-15 — End: 2017-11-15

## 2017-11-15 MED ORDER — IBUPROFEN 600 MG PO TABS
600.0000 mg | ORAL_TABLET | Freq: Four times a day (QID) | ORAL | Status: DC
  Administered 2017-11-15 – 2017-11-17 (×8): 600 mg via ORAL
  Filled 2017-11-15 (×8): qty 1

## 2017-11-15 MED ORDER — TETANUS-DIPHTH-ACELL PERTUSSIS 5-2.5-18.5 LF-MCG/0.5 IM SUSP: 0.5000 mL | Freq: Once | INTRAMUSCULAR | Status: DC

## 2017-11-15 MED ORDER — SIMETHICONE 80 MG PO CHEW: 80.0000 mg | CHEWABLE_TABLET | ORAL | Status: DC | PRN

## 2017-11-15 MED ORDER — WITCH HAZEL-GLYCERIN EX PADS: 1.0000 "application " | MEDICATED_PAD | CUTANEOUS | Status: DC | PRN

## 2017-11-15 MED ORDER — LACTATED RINGERS IV BOLUS: 1000.0000 mL | Freq: Once | INTRAVENOUS | Status: DC

## 2017-11-15 MED ORDER — SENNOSIDES-DOCUSATE SODIUM 8.6-50 MG PO TABS
2.0000 | ORAL_TABLET | ORAL | Status: DC
  Administered 2017-11-16: 2 via ORAL
  Filled 2017-11-15 (×2): qty 2

## 2017-11-15 MED ORDER — METHYLERGONOVINE MALEATE 0.2 MG/ML IJ SOLN
0.2000 mg | Freq: Once | INTRAMUSCULAR | Status: AC
  Administered 2017-11-15: 0.2 mg via INTRAMUSCULAR

## 2017-11-15 MED ORDER — PRENATAL MULTIVITAMIN CH
1.0000 | ORAL_TABLET | Freq: Every day | ORAL | Status: DC
  Administered 2017-11-16 – 2017-11-17 (×2): 1 via ORAL
  Filled 2017-11-15 (×2): qty 1

## 2017-11-15 MED ORDER — BENZOCAINE-MENTHOL 20-0.5 % EX AERO: 1.0000 "application " | INHALATION_SPRAY | CUTANEOUS | Status: DC | PRN

## 2017-11-15 MED ORDER — IBUPROFEN 600 MG PO TABS
600.0000 mg | ORAL_TABLET | Freq: Once | ORAL | Status: AC
Start: 2017-11-15 — End: 2017-11-15
  Administered 2017-11-15: 600 mg via ORAL
  Filled 2017-11-15: qty 1

## 2017-11-15 MED ORDER — ONDANSETRON HCL 4 MG/2ML IJ SOLN: 4.0000 mg | INTRAMUSCULAR | Status: DC | PRN

## 2017-11-15 MED ORDER — OXYTOCIN 10 UNIT/ML IJ SOLN
INTRAMUSCULAR | Status: AC
  Administered 2017-11-15: 10 [IU]
  Filled 2017-11-15: qty 1

## 2017-11-15 MED ORDER — DIPHENHYDRAMINE HCL 25 MG PO CAPS: 25.0000 mg | ORAL_CAPSULE | Freq: Four times a day (QID) | ORAL | Status: DC | PRN

## 2017-11-15 MED ORDER — ONDANSETRON HCL 4 MG PO TABS: 4.0000 mg | ORAL_TABLET | ORAL | Status: DC | PRN

## 2017-11-15 MED ORDER — COCONUT OIL OIL: 1.0000 "application " | TOPICAL_OIL | Status: DC | PRN

## 2017-11-15 MED ORDER — DIBUCAINE 1 % RE OINT: 1.0000 "application " | TOPICAL_OINTMENT | RECTAL | Status: DC | PRN

## 2017-11-15 MED ORDER — ACETAMINOPHEN 325 MG PO TABS: 650.0000 mg | ORAL_TABLET | ORAL | Status: DC | PRN

## 2017-11-15 NOTE — MAU Note (Signed)
RN brought discharge paperwork to patient's room however patient had already left.

## 2017-11-15 NOTE — Progress Notes (Signed)
At 1445, All admission paper work reviewed, instructed and given to Mother through [employer] [phone] and Mother is receptive to all teaching and paperwork received.

## 2017-11-15 NOTE — MAU Note (Signed)
CTX every 10 mins.  No LOF/VB.  + FM.  No complications with the pregnancy.

## 2017-11-15 NOTE — H&P (Addendum)
Lindsey Gilbert is a 32 y.o. female presenting for SOL and active labor. Ctx started about 9 hours ago. Reports ROM about 15 min prior to arrival. Her pregnancy is complicated by grand multiparity and language barrier.  OB History    Gravida  5   Para  4   Term  4   Preterm      AB      Living  4     SAB      TAB      Ectopic      Multiple  0   Live Births  1          Past Medical History:  Diagnosis Date  . Medical history non-contributory    Past Surgical History:  Procedure Laterality Date  . NO PAST SURGERIES     Family History: family history is not on file. Social History:  reports that she has never smoked. She has never used smokeless tobacco. She reports that she does not drink alcohol or use drugs.     Maternal Diabetes: No Genetic Screening: Normal Maternal Ultrasounds/Referrals: Normal Fetal Ultrasounds or other Referrals:  None Maternal Substance Abuse:  No Significant Maternal Medications:  None Significant Maternal Lab Results:  None Other Comments:  None  Review of Systems  Constitutional: Negative.   Gastrointestinal: Positive for abdominal pain.  All other systems reviewed and are negative.  Maternal Medical History:  Reason for admission: Rupture of membranes and contractions.   Contractions: Onset was 6-12 hours ago.   Frequency: regular.   Duration is approximately 1 minute.   Perceived severity is strong.    Fetal activity: Perceived fetal activity is normal.   Last perceived fetal movement was within the past hour.    Prenatal complications: no prenatal complications Prenatal Complications - Diabetes: none.      Blood pressure 122/66, pulse 76, last menstrual period 01/27/2017, unknown if currently breastfeeding. Maternal Exam:  Uterine Assessment: Contraction strength is firm.  Contraction duration is 60 seconds. Contraction frequency is regular.   Abdomen: Fetal presentation: vertex  Introitus: Normal vulva. Normal  vagina.  Amniotic fluid character: meconium stained.  Pelvis: adequate for delivery.   Cervix: Cervix evaluated by digital exam.     Physical Exam  Nursing note and vitals reviewed. Constitutional: She is oriented to person, place, and time. She appears well-developed and well-nourished.  Respiratory: Effort normal.  GI: Soft.  Genitourinary: Vagina normal and uterus normal.  Musculoskeletal: Normal range of motion.  Neurological: She is alert and oriented to person, place, and time.  Skin: Skin is warm and dry.  Psychiatric: She has a normal mood and affect. Her behavior is normal. Judgment and thought content normal.    Prenatal labs: ABO, Rh:  B neg Antibody:  neg Rubella:  immune RPR:   neg HBsAg:   neg HIV:   neg GBS:   neg  Assessment/Plan: SVD in MAU Admit to postpartum Breastfeeding, IUD for contraception   Bernerd LimboJamilla R Walker, SNM 11/15/2017, 10:17 AM   Midwife attestation: I have seen and examined this patient; I agree with above documentation in the student's note.   PE: Gen: calm comfortable, NAD Resp: normal effort and rate   ROS, labs, PMH reviewed  Assessment/Plan: Lindsey Gilbert is a 32 y.o. [id number] here for active labor and precipitous delivery Admit to pp Breastfeeding IUD for contraception  Donette LarryMelanie Lil Lepage, CNM  11/15/2017, 11:49 AM

## 2017-11-16 ENCOUNTER — Encounter (HOSPITAL_COMMUNITY): Payer: Self-pay

## 2017-11-16 LAB — CBC
HCT: 34.3 % — ABNORMAL LOW (ref 36.0–46.0)
HEMOGLOBIN: 11.5 g/dL — AB (ref 12.0–15.0)
MCH: 27.8 pg (ref 26.0–34.0)
MCHC: 33.5 g/dL (ref 30.0–36.0)
MCV: 83.1 fL (ref 80.0–100.0)
PLATELETS: 190 10*3/uL (ref 150–400)
RBC: 4.13 MIL/uL (ref 3.87–5.11)
RDW: 15 % (ref 11.5–15.5)
WBC: 9.2 10*3/uL (ref 4.0–10.5)

## 2017-11-16 LAB — RPR: RPR: NONREACTIVE

## 2017-11-16 MED ORDER — IBUPROFEN 600 MG PO TABS: 600.0000 mg | ORAL_TABLET | Freq: Four times a day (QID) | ORAL | 0 refills | Status: DC

## 2017-11-16 NOTE — Lactation Note (Signed)
This note was copied from a baby's chart. Lactation Consultation Note  Patient Name: Lindsey Gilbert ZOXWR'UToday's Date: Gilbert Reason for consult: Initial assessment;Term Breastfeeding consultation services and support information given.  This is mom's fifth baby and she breastfed her previous babies for 8 months.  She states baby is latching well but she has chosen to give formula after some feeds when baby still acting hungry.  Instructed to feed with cues and offer both breasts at a feeding.  Baby is cueing so placed in cradle hold.  Instructed on hand expression.  Reminded mom to keep baby on her side and close to breast.  Mom using breast massage during feeding.  Feeding is comfortable.  Encouraged to call for assist/concerns prn.  Maternal Data Has patient been taught Hand Expression?: Yes Does the patient have breastfeeding experience prior to this delivery?: Yes  Feeding Feeding Type: Breast Fed Nipple Type: Slow - flow  LATCH Score Latch: Grasps breast easily, tongue down, lips flanged, rhythmical sucking.  Audible Swallowing: A few with stimulation  Type of Nipple: Everted at rest and after stimulation  Comfort (Breast/Nipple): Soft / non-tender  Hold (Positioning): Assistance needed to correctly position infant at breast and maintain latch.  LATCH Score: 8  Interventions Interventions: Assisted with latch;Breast compression;Skin to skin;Adjust position;Breast massage;Support pillows;Hand express  Lactation Tools Discussed/Used     Consult Status Consult Status: Follow-up Date: 11/17/17    Huston FoleyMOULDEN, Lindsey Gilbert, Lindsey Gilbert

## 2017-11-16 NOTE — Discharge Instructions (Signed)

## 2017-11-16 NOTE — Discharge Summary (Addendum)
Obstetrics Discharge Summary OB/GYN Faculty Practice   Patient Name: Lindsey Gilbert DOB: 01/13/1985 MRN: [id number]  Date of admission: 11/15/2017 Delivering MD: Donette LarryBHAMBRI, MELANIE   Date of discharge: 11/17/2017  Admitting diagnosis: 40wks, water broke, ctx 2-813min Intrauterine pregnancy: 2347w3d     Secondary diagnosis:   Active Problems:   SVD (spontaneous vaginal delivery)  Additional problems:  . Grand multiparity     Discharge diagnosis: Term Pregnancy Delivered                                            Postpartum procedures: None  Complications: None  *Arabic video [facility] course: Ingri Gilbert is a 32 y.o. 4847w3d who was admitted for SOL. Her pregnancy was uncomplicated. Her labor course was notable for precipitous delivery. Delivery was uncomplicated. Please see delivery/op note for additional details. Her postpartum course was uncomplicated. She was breastfeeding without difficulty. By day of discharge, she was passing flatus, urinating, eating and drinking without difficulty. Her pain was well-controlled, and she was discharged home with ibuprofen. She will follow-up in clinic in 4 weeks.   Physical exam  Vitals:   11/15/17 1522 11/15/17 2204 11/16/17 0501 11/17/17 0547  BP: 115/77 114/72 (!) 105/57 110/73  Pulse: 85 79 (!) 53 69  Resp: 18 18 18    Temp: 98 F (36.7 C) 97.9 F (36.6 C) 98.3 F (36.8 C) 97.8 F (36.6 C)  TempSrc: Oral Oral Oral Oral  SpO2: 100%  100%    General: well-appearing Lochia: appropriate Uterine Fundus: firm Incision: N/A DVT Evaluation: No evidence of DVT seen on physical exam. Negative Homan's sign. Labs: Lab Results  Component Value Date   WBC 9.2 11/16/2017   HGB 11.5 (L) 11/16/2017   HCT 34.3 (L) 11/16/2017   MCV 83.1 11/16/2017   PLT 190 11/16/2017   CMP Latest Ref Rng & Units 10/20/2017  Glucose 70 - 99 mg/dL 88  BUN 6 - 20 mg/dL 7  Creatinine 4.090.44 - 8.111.00 mg/dL 9.140.52  Sodium 782135 - 956145 mmol/L 134(L)   Potassium 3.5 - 5.1 mmol/L 3.7  Chloride 98 - 111 mmol/L 104  CO2 22 - 32 mmol/L 23  Calcium 8.9 - 10.3 mg/dL 2.1(H8.4(L)  Total Protein 6.5 - 8.1 g/dL 6.5  Total Bilirubin 0.3 - 1.2 mg/dL 0.3  Alkaline Phos 38 - 126 U/L 81  AST 15 - 41 U/L 28  ALT 0 - 44 U/L 32    Discharge instructions: Per After Visit Summary and "Baby and Me Booklet"  After visit meds:     Postpartum contraception: IUD Diet: Routine Diet Activity: Advance as tolerated. Pelvic rest for 6 weeks.   Outpatient follow up:4 weeks, encouraged to call health department for postpartum visit   Newborn Data: Live born female  Birth Weight: 6 lb 10.7 oz (3025 g) APGAR: 8, 9  Newborn Delivery   Birth date/time:  11/15/2017 09:19:00 Delivery type:  Vaginal, Spontaneous    Baby Feeding: Breast Disposition:home with mother  Lindsey NievesJulia Abraham, MD Family Medicine Resident  Attestation: I have seen this patient and agree with the resident's documentation. I have examined them separately, and we have discussed the plan of care.  Lindsey DeerLaurel S. Earlene PlaterWallace, DO OB/GYN Fellow

## 2017-11-17 NOTE — Discharge Summary (Signed)
OB Discharge Summary     Patient Name: Lindsey Gilbert DOB: 03/31/1985 MRN: [id number]  Date of admission: 11/15/2017 Delivering MD: Donette LarryBHAMBRI, MELANIE   Date of discharge: 11/17/2017  Admitting diagnosis: 40wks, water broke, ctx 2-993min Intrauterine pregnancy: 6710w2d     Secondary diagnosis:  Active Problems:   SVD (spontaneous vaginal delivery)  Additional problems: Grand multiparity, RH negative (Infant negative as well)      Discharge diagnosis: Term Pregnancy Delivered                                                                                                Post partum procedures:None   Augmentation: none   Complications: None  Hospital course:  Onset of Labor With Vaginal Delivery     32 y.o. yo G5P5005 at 4410w2d was admitted in Active Labor on 11/15/2017. Labor course significant for precipitous delivery while in the MAU and as follows:  Membrane Rupture Time/Date: 9:00 AM ,11/15/2017   Intrapartum Procedures: Episiotomy: None [1]                                         Lacerations:  2nd degree [3]  Patient had a delivery of a Viable infant. 11/15/2017  Information for the patient's newborn:  Almyra Bracehmed, Girl Samanda [[id number]][[id number]]  Delivery Method: Vag-Spont    Pateint had an uncomplicated postpartum course.  She is ambulating, tolerating a regular diet, passing flatus, and urinating well. Patient is discharged home in stable condition on 11/17/17.   Physical exam  Vitals:   11/15/17 1522 11/15/17 2204 11/16/17 0501 11/17/17 0547  BP: 115/77 114/72 (!) 105/57 110/73  Pulse: 85 79 (!) 53 69  Resp: 18 18 18    Temp: 98 F (36.7 C) 97.9 F (36.6 C) 98.3 F (36.8 C) 97.8 F (36.6 C)  TempSrc: Oral Oral Oral Oral  SpO2: 100%  100%    General: alert, cooperative and no distress Lochia: appropriate Uterine Fundus: firm Incision: N/A DVT Evaluation: No evidence of DVT seen on physical exam. No significant calf/ankle edema. Labs: Lab Results  Component Value Date    WBC 9.2 11/16/2017   HGB 11.5 (L) 11/16/2017   HCT 34.3 (L) 11/16/2017   MCV 83.1 11/16/2017   PLT 190 11/16/2017   CMP Latest Ref Rng & Units 10/20/2017  Glucose 70 - 99 mg/dL 88  BUN 6 - 20 mg/dL 7  Creatinine 0.860.44 - 5.781.00 mg/dL 4.690.52  Sodium 629135 - 528145 mmol/L 134(L)  Potassium 3.5 - 5.1 mmol/L 3.7  Chloride 98 - 111 mmol/L 104  CO2 22 - 32 mmol/L 23  Calcium 8.9 - 10.3 mg/dL 4.1(L8.4(L)  Total Protein 6.5 - 8.1 g/dL 6.5  Total Bilirubin 0.3 - 1.2 mg/dL 0.3  Alkaline Phos 38 - 126 U/L 81  AST 15 - 41 U/L 28  ALT 0 - 44 U/L 32    Discharge instruction: per After Visit Summary and "Baby and Me Booklet".  After visit meds:  Allergies as of 11/17/2017  Reactions   Penicillins Rash   Has patient had a PCN reaction causing immediate rash, facial/tongue/throat swelling, SOB or lightheadedness with hypotension: Yes Has patient had a PCN reaction causing severe rash involving mucus membranes or skin necrosis: No Has patient had a PCN reaction that required hospitalization: No Has patient had a PCN reaction occurring within the last 10 years: No If all of the above answers are "NO", then may proceed with Cephalosporin use.      Medication List    TAKE these medications   hydrOXYzine 10 MG tablet Commonly known as:  ATARAX/VISTARIL Take 1 tablet (10 mg total) by mouth 3 (three) times daily as needed.   ibuprofen 600 MG tablet Commonly known as:  ADVIL,MOTRIN Take 1 tablet (600 mg total) by mouth every 6 (six) hours.   Olopatadine HCl 0.2 % Soln Apply 1 drop to eye 2 (two) times daily as needed.   prenatal multivitamin Tabs tablet Take 1 tablet by mouth at bedtime.       Diet: routine diet  Activity: Advance as tolerated. Pelvic rest for 6 weeks.   Outpatient follow up:4 weeks Follow up Appt:No future appointments. Follow up Visit:No follow-ups on file.  Postpartum contraception: IUD Mirena  Newborn Data: Live born female  Birth Weight: 6 lb 10.7 oz (3025  g) APGAR: 8, 9  Newborn Delivery   Birth date/time:  11/15/2017 09:19:00 Delivery type:  Vaginal, Spontaneous     Baby Feeding: Breast Disposition:home with mother   11/17/2017 Allayne Stack, DO

## 2017-11-17 NOTE — Progress Notes (Signed)
Interpreter Ronnie #[id number] 

## 2017-11-19 LAB — TYPE AND SCREEN
ABO/RH(D): B NEG
ANTIBODY SCREEN: POSITIVE
Unit division: 0
Unit division: 0

## 2017-11-19 LAB — BPAM RBC
Blood Product Expiration Date: 201912162359
Blood Product Expiration Date: 201912162359
Unit Type and Rh: 9500
Unit Type and Rh: 9500

## 2020-01-21 ENCOUNTER — Ambulatory Visit: Payer: Self-pay

## 2020-01-27 ENCOUNTER — Ambulatory Visit (INDEPENDENT_AMBULATORY_CARE_PROVIDER_SITE_OTHER): Payer: BLUE CROSS/BLUE SHIELD

## 2020-01-27 ENCOUNTER — Other Ambulatory Visit: Payer: Self-pay

## 2020-01-27 DIAGNOSIS — Z23 Encounter for immunization: Secondary | ICD-10-CM | POA: Diagnosis not present

## 2020-01-27 NOTE — Progress Notes (Signed)
   Covid-19 Vaccination Clinic  Name:  Lindsey Gilbert    MRN: [id number] DOB: May 14, 1985  01/27/2020  [name] was observed post Covid-19 immunization for 15 minutes without incident. She was provided with Vaccine Information Sheet and instruction to access the V-Safe system.   [name] was instructed to call 911 with any severe reactions post vaccine: Marland Kitchen Difficulty breathing  . Swelling of face and throat  . A fast heartbeat  . A bad rash all over body  . Dizziness and weakness   Immunizations Administered    Name Date Dose VIS Date Route   Pfizer COVID-19 Vaccine 01/27/2020  9:11 AM 0.3 mL 10/23/2019 Intramuscular   Manufacturer: [employer], [employer]   Lot: [id number]   NDC: [address]-3

## 2020-03-07 ENCOUNTER — Ambulatory Visit (INDEPENDENT_AMBULATORY_CARE_PROVIDER_SITE_OTHER): Payer: BLUE CROSS/BLUE SHIELD

## 2020-03-07 DIAGNOSIS — Z23 Encounter for immunization: Secondary | ICD-10-CM | POA: Diagnosis not present

## 2020-03-07 NOTE — Progress Notes (Signed)
   Covid-19 Vaccination Clinic  Name:  Kayani Rapaport    MRN: [id number] DOB: 10/31/1985  03/07/2020  Ms. Barkett was observed post Covid-19 immunization for 15 minutes without incident. She was provided with Vaccine Information Sheet and instruction to access the V-Safe system.   Ms. Giuffre was instructed to call 911 with any severe reactions post vaccine: Marland Kitchen Difficulty breathing  . Swelling of face and throat  . A fast heartbeat  . A bad rash all over body  . Dizziness and weakness   Immunizations Administered    Name Date Dose VIS Date Route   PFIZER Comrnaty(Gray TOP) Covid-19 Vaccine 03/07/2020  9:03 AM 0.3 mL 12/12/2019 Intramuscular   Manufacturer: [employer], [employer]   Lot: [id number]   NDC: [phone]

## 2021-01-03 NOTE — L&D Delivery Note (Signed)
Delivery Note Pt progressed quickly to C/C/"the baby is coming".   After 1 big push, at 1:59 AM a viable female was delivered via Vaginal, Spontaneous (Presentation: Left Occiput Anterior).  APGAR: 8/9, ; weight pending.  After 1 minute, the cord was clamped and cut. 20 units of pitocin diluted in 500cc LR was infused rapidly IV.  The placenta separated spontaneously and delivered via CCT and maternal pushing effort.  It was inspected and appears to be intact with a 3 VC. D/T grand multiparity, TXA 1gm given IV  Anesthesia: None Episiotomy: None Lacerations: 1st degree Suture Repair:  Est. Blood Loss (mL): 300  Mom to postpartum.  Baby to Couplet care / Skin to Skin.  Lindsey Gilbert 08/24/2021, 2:10 AM

## 2021-04-08 LAB — OB RESULTS CONSOLE HEPATITIS B SURFACE ANTIGEN: Hepatitis B Surface Ag: NEGATIVE

## 2021-07-14 LAB — OB RESULTS CONSOLE ABO/RH: RH Type: NEGATIVE

## 2021-07-14 LAB — OB RESULTS CONSOLE HIV ANTIBODY (ROUTINE TESTING): HIV: NONREACTIVE

## 2021-07-14 LAB — HEPATITIS C ANTIBODY: HCV Ab: NEGATIVE

## 2021-07-14 LAB — OB RESULTS CONSOLE RPR: RPR: NONREACTIVE

## 2021-07-14 LAB — OB RESULTS CONSOLE RUBELLA ANTIBODY, IGM: Rubella: IMMUNE

## 2021-08-14 LAB — OB RESULTS CONSOLE GC/CHLAMYDIA
Chlamydia: NEGATIVE
Neisseria Gonorrhea: NEGATIVE

## 2021-08-19 ENCOUNTER — Inpatient Hospital Stay (HOSPITAL_COMMUNITY)
Admission: AD | Admit: 2021-08-19 | Discharge: 2021-08-19 | Disposition: A | Discharge status: Home / Self Care | Payer: BLUE CROSS/BLUE SHIELD | Attending: Obstetrics & Gynecology | Admitting: Obstetrics & Gynecology

## 2021-08-19 ENCOUNTER — Encounter (HOSPITAL_COMMUNITY): Payer: Self-pay | Admitting: Obstetrics & Gynecology

## 2021-08-19 DIAGNOSIS — O479 False labor, unspecified: Secondary | ICD-10-CM

## 2021-08-19 DIAGNOSIS — O471 False labor at or after 37 completed weeks of gestation: Secondary | ICD-10-CM | POA: Insufficient documentation

## 2021-08-19 DIAGNOSIS — Z3A39 39 weeks gestation of pregnancy: Secondary | ICD-10-CM | POA: Diagnosis not present

## 2021-08-19 DIAGNOSIS — Z0371 Encounter for suspected problem with amniotic cavity and membrane ruled out: Secondary | ICD-10-CM | POA: Insufficient documentation

## 2021-08-19 NOTE — MAU Note (Signed)
.  Ninnie Driskill is a 36 y.o. at [redacted]w[redacted]d here in MAU reporting: Having ctx 1-2 hr. Reports fetal movement has been little less today. Denies any vag bleeding or leaking.  LMP:  Onset of complaint: this morning Pain score: 5 Vitals:   08/19/21 0909  BP: 102/84  Pulse: 88  Resp: 18  Temp: 97.7 F (36.5 C)     FHT:135 Lab orders placed from triage:  labor eval

## 2021-08-19 NOTE — MAU Note (Signed)
I have communicated with Dr. Crissie Reese and reviewed vital signs:  Vitals:   08/19/21 0909 08/19/21 1009  BP: 102/84 112/76  Pulse: 88 79  Resp: 18   Temp: 97.7 F (36.5 C)     Vaginal exam:  Dilation: 1.5 Effacement (%): 30 Cervical Position: Posterior,   Also reviewed contraction pattern and that non-stress test is reactive.  It has been documented that patient is contracting every 10-12 minutes with no cervical change from previous cervical check in office not indicating active labor.  Patient denies any other complaints.  Based on this report provider has given order for discharge.  A discharge order and diagnosis entered by a provider.   Labor discharge instructions reviewed with patient.

## 2021-08-19 NOTE — MAU Provider Note (Signed)
Lindsey Gilbert is a 36 y.o. G53P5005 female at [redacted]w[redacted]d  RN Labor check, not seen by provider SVE by RN: Dilation: 1.5 Effacement (%): 30 NST: FHR baseline 130 bpm, Variability: moderate, Accelerations:present, Decelerations:  Absent= Cat I/Reactive Toco: rare  D/C home  Venora Maples MD/MPH 08/19/2021 9:54 AM

## 2021-08-23 ENCOUNTER — Other Ambulatory Visit: Payer: Self-pay

## 2021-08-23 ENCOUNTER — Inpatient Hospital Stay (HOSPITAL_COMMUNITY)
Admission: AD | Admit: 2021-08-23 | Discharge: 2021-08-25 | DRG: 807 | Disposition: A | Discharge status: Home / Self Care | Payer: BLUE CROSS/BLUE SHIELD | Attending: Obstetrics and Gynecology | Admitting: Obstetrics and Gynecology

## 2021-08-23 DIAGNOSIS — Z6791 Unspecified blood type, Rh negative: Secondary | ICD-10-CM

## 2021-08-23 DIAGNOSIS — O99824 Streptococcus B carrier state complicating childbirth: Principal | ICD-10-CM | POA: Diagnosis present

## 2021-08-23 DIAGNOSIS — Z880 Allergy status to penicillin: Secondary | ICD-10-CM

## 2021-08-23 DIAGNOSIS — Z3A40 40 weeks gestation of pregnancy: Secondary | ICD-10-CM

## 2021-08-23 DIAGNOSIS — O26893 Other specified pregnancy related conditions, third trimester: Secondary | ICD-10-CM | POA: Diagnosis present

## 2021-08-24 ENCOUNTER — Encounter (HOSPITAL_COMMUNITY): Payer: Self-pay | Admitting: Anesthesiology

## 2021-08-24 ENCOUNTER — Encounter (HOSPITAL_COMMUNITY): Payer: Self-pay | Admitting: Family Medicine

## 2021-08-24 DIAGNOSIS — O99824 Streptococcus B carrier state complicating childbirth: Secondary | ICD-10-CM | POA: Diagnosis present

## 2021-08-24 DIAGNOSIS — O4202 Full-term premature rupture of membranes, onset of labor within 24 hours of rupture: Secondary | ICD-10-CM

## 2021-08-24 DIAGNOSIS — Z3A40 40 weeks gestation of pregnancy: Secondary | ICD-10-CM

## 2021-08-24 DIAGNOSIS — Z880 Allergy status to penicillin: Secondary | ICD-10-CM | POA: Diagnosis not present

## 2021-08-24 DIAGNOSIS — Z6791 Unspecified blood type, Rh negative: Secondary | ICD-10-CM | POA: Diagnosis not present

## 2021-08-24 DIAGNOSIS — O26893 Other specified pregnancy related conditions, third trimester: Secondary | ICD-10-CM | POA: Diagnosis present

## 2021-08-24 LAB — TYPE AND SCREEN
ABO/RH(D): B NEG
Antibody Screen: POSITIVE

## 2021-08-24 LAB — POCT FERN TEST: POCT Fern Test: POSITIVE

## 2021-08-24 LAB — CBC
HCT: 38.7 % (ref 36.0–46.0)
Hemoglobin: 13 g/dL (ref 12.0–15.0)
MCH: 27.4 pg (ref 26.0–34.0)
MCHC: 33.6 g/dL (ref 30.0–36.0)
MCV: 81.5 fL (ref 80.0–100.0)
Platelets: 201 10*3/uL (ref 150–400)
RBC: 4.75 MIL/uL (ref 3.87–5.11)
RDW: 15 % (ref 11.5–15.5)
WBC: 8 10*3/uL (ref 4.0–10.5)
nRBC: 0 % (ref 0.0–0.2)

## 2021-08-24 LAB — RPR: RPR Ser Ql: NONREACTIVE

## 2021-08-24 MED ORDER — TRANEXAMIC ACID-NACL 1000-0.7 MG/100ML-% IV SOLN
1000.0000 mg | INTRAVENOUS | Status: AC
  Administered 2021-08-24: 1000 mg via INTRAVENOUS

## 2021-08-24 MED ORDER — ONDANSETRON HCL 4 MG/2ML IJ SOLN: 4.0000 mg | INTRAMUSCULAR | Status: DC | PRN

## 2021-08-24 MED ORDER — FENTANYL-BUPIVACAINE-NACL 0.5-0.125-0.9 MG/250ML-% EP SOLN
12.0000 mL/h | EPIDURAL | Status: DC | PRN
  Filled 2021-08-24: qty 250

## 2021-08-24 MED ORDER — PHENYLEPHRINE 80 MCG/ML (10ML) SYRINGE FOR IV PUSH (FOR BLOOD PRESSURE SUPPORT): 80.0000 ug | PREFILLED_SYRINGE | INTRAVENOUS | Status: DC | PRN

## 2021-08-24 MED ORDER — EPHEDRINE 5 MG/ML INJ: 10.0000 mg | INTRAVENOUS | Status: DC | PRN

## 2021-08-24 MED ORDER — OXYCODONE-ACETAMINOPHEN 5-325 MG PO TABS: 2.0000 | ORAL_TABLET | ORAL | Status: DC | PRN

## 2021-08-24 MED ORDER — IBUPROFEN 600 MG PO TABS
600.0000 mg | ORAL_TABLET | Freq: Four times a day (QID) | ORAL | Status: DC
  Administered 2021-08-24 – 2021-08-25 (×6): 600 mg via ORAL
  Filled 2021-08-24 (×6): qty 1

## 2021-08-24 MED ORDER — ONDANSETRON HCL 4 MG/2ML IJ SOLN: 4.0000 mg | Freq: Four times a day (QID) | INTRAMUSCULAR | Status: DC | PRN

## 2021-08-24 MED ORDER — OXYTOCIN-SODIUM CHLORIDE 30-0.9 UT/500ML-% IV SOLN
INTRAVENOUS | Status: AC
  Filled 2021-08-24: qty 500

## 2021-08-24 MED ORDER — COCONUT OIL OIL: 1.0000 | TOPICAL_OIL | Status: DC | PRN

## 2021-08-24 MED ORDER — BENZOCAINE-MENTHOL 20-0.5 % EX AERO: 1.0000 | INHALATION_SPRAY | CUTANEOUS | Status: DC | PRN

## 2021-08-24 MED ORDER — SIMETHICONE 80 MG PO CHEW: 80.0000 mg | CHEWABLE_TABLET | ORAL | Status: DC | PRN

## 2021-08-24 MED ORDER — DIBUCAINE (PERIANAL) 1 % EX OINT: 1.0000 | TOPICAL_OINTMENT | CUTANEOUS | Status: DC | PRN

## 2021-08-24 MED ORDER — SOD CITRATE-CITRIC ACID 500-334 MG/5ML PO SOLN: 30.0000 mL | ORAL | Status: DC | PRN

## 2021-08-24 MED ORDER — BISACODYL 10 MG RE SUPP: 10.0000 mg | Freq: Every day | RECTAL | Status: DC | PRN

## 2021-08-24 MED ORDER — MEDROXYPROGESTERONE ACETATE 150 MG/ML IM SUSP: 150.0000 mg | INTRAMUSCULAR | Status: DC | PRN

## 2021-08-24 MED ORDER — FLEET ENEMA 7-19 GM/118ML RE ENEM: 1.0000 | ENEMA | Freq: Every day | RECTAL | Status: DC | PRN

## 2021-08-24 MED ORDER — LACTATED RINGERS IV SOLN: 500.0000 mL | INTRAVENOUS | Status: DC | PRN

## 2021-08-24 MED ORDER — ONDANSETRON HCL 4 MG PO TABS: 4.0000 mg | ORAL_TABLET | ORAL | Status: DC | PRN

## 2021-08-24 MED ORDER — OXYTOCIN-SODIUM CHLORIDE 30-0.9 UT/500ML-% IV SOLN: 2.5000 [IU]/h | INTRAVENOUS | Status: DC

## 2021-08-24 MED ORDER — PRENATAL MULTIVITAMIN CH
1.0000 | ORAL_TABLET | Freq: Every day | ORAL | Status: DC
  Administered 2021-08-24 – 2021-08-25 (×2): 1 via ORAL
  Filled 2021-08-24 (×2): qty 1

## 2021-08-24 MED ORDER — LACTATED RINGERS IV SOLN: INTRAVENOUS | Status: DC

## 2021-08-24 MED ORDER — TETANUS-DIPHTH-ACELL PERTUSSIS 5-2.5-18.5 LF-MCG/0.5 IM SUSY: 0.5000 mL | PREFILLED_SYRINGE | Freq: Once | INTRAMUSCULAR | Status: DC

## 2021-08-24 MED ORDER — WITCH HAZEL-GLYCERIN EX PADS: 1.0000 | MEDICATED_PAD | CUTANEOUS | Status: DC | PRN

## 2021-08-24 MED ORDER — DIPHENHYDRAMINE HCL 25 MG PO CAPS: 25.0000 mg | ORAL_CAPSULE | Freq: Four times a day (QID) | ORAL | Status: DC | PRN

## 2021-08-24 MED ORDER — SENNOSIDES-DOCUSATE SODIUM 8.6-50 MG PO TABS
2.0000 | ORAL_TABLET | ORAL | Status: DC
  Administered 2021-08-25: 2 via ORAL
  Filled 2021-08-24 (×2): qty 2

## 2021-08-24 MED ORDER — CLINDAMYCIN PHOSPHATE 900 MG/50ML IV SOLN
900.0000 mg | Freq: Once | INTRAVENOUS | Status: AC
  Administered 2021-08-24: 900 mg via INTRAVENOUS
  Filled 2021-08-24: qty 50

## 2021-08-24 MED ORDER — DIPHENHYDRAMINE HCL 50 MG/ML IJ SOLN: 12.5000 mg | INTRAMUSCULAR | Status: DC | PRN

## 2021-08-24 MED ORDER — TRANEXAMIC ACID-NACL 1000-0.7 MG/100ML-% IV SOLN
INTRAVENOUS | Status: AC
  Filled 2021-08-24: qty 100

## 2021-08-24 MED ORDER — METHYLERGONOVINE MALEATE 0.2 MG/ML IJ SOLN: 0.2000 mg | INTRAMUSCULAR | Status: DC | PRN

## 2021-08-24 MED ORDER — LIDOCAINE HCL (PF) 1 % IJ SOLN: 30.0000 mL | INTRAMUSCULAR | Status: DC | PRN

## 2021-08-24 MED ORDER — ACETAMINOPHEN 325 MG PO TABS: 650.0000 mg | ORAL_TABLET | ORAL | Status: DC | PRN

## 2021-08-24 MED ORDER — OXYCODONE-ACETAMINOPHEN 5-325 MG PO TABS: 1.0000 | ORAL_TABLET | ORAL | Status: DC | PRN

## 2021-08-24 MED ORDER — FERROUS SULFATE 325 (65 FE) MG PO TABS
325.0000 mg | ORAL_TABLET | ORAL | Status: DC
  Administered 2021-08-24: 325 mg via ORAL
  Filled 2021-08-24: qty 1

## 2021-08-24 MED ORDER — RHO D IMMUNE GLOBULIN 1500 UNIT/2ML IJ SOSY
300.0000 ug | PREFILLED_SYRINGE | Freq: Once | INTRAMUSCULAR | Status: AC
  Administered 2021-08-24: 300 ug via INTRAVENOUS
  Filled 2021-08-24: qty 2

## 2021-08-24 MED ORDER — MEASLES, MUMPS & RUBELLA VAC IJ SOLR: 0.5000 mL | Freq: Once | INTRAMUSCULAR | Status: DC

## 2021-08-24 MED ORDER — METHYLERGONOVINE MALEATE 0.2 MG PO TABS: 0.2000 mg | ORAL_TABLET | ORAL | Status: DC | PRN

## 2021-08-24 MED ORDER — LACTATED RINGERS IV SOLN: 500.0000 mL | Freq: Once | INTRAVENOUS | Status: DC

## 2021-08-24 MED ORDER — OXYTOCIN BOLUS FROM INFUSION
333.0000 mL | Freq: Once | INTRAVENOUS | Status: AC
  Administered 2021-08-24: 333 mL via INTRAVENOUS

## 2021-08-24 NOTE — MAU Note (Signed)
Pt says SROM- at 2300 when she stood - still feels fluid running out  Feels strong UC's- after SROM [facility]- with [facility] Was here Friday - VE 2 cm GBS- positive- she says

## 2021-08-24 NOTE — H&P (Signed)
Indie Ho is a 36 y.o. female [id number] with IUP at [redacted]w[redacted]d by LMP/US presenting for SROM/labor.  She reports positive fetal movement. She denies leakage of fluid or vaginal bleeding.  Prenatal History/Complications: PNC at [facility] Pregnancy complications:  - Past Medical History: Past Medical History:  Diagnosis Date   Medical history non-contributory     Past Surgical History: Past Surgical History:  Procedure Laterality Date   NO PAST SURGERIES      Obstetrical History: OB History     Gravida  6   Para  5   Term  5   Preterm      AB      Living  5      SAB      IAB      Ectopic      Multiple  0   Live Births  5            Social History: Social History   Socioeconomic History   Marital status: Married    Spouse name: Not on file   Number of children: Not on file   Years of education: Not on file   Highest education level: Not on file  Occupational History   Not on file  Tobacco Use   Smoking status: Never   Smokeless tobacco: Never  Vaping Use   Vaping Use: Never used  Substance and Sexual Activity   Alcohol use: No   Drug use: No   Sexual activity: Yes  Other Topics Concern   Not on file  Social History Narrative   Not on file   Social Determinants of Health   Financial Resource Strain: Not on file  Food Insecurity: Not on file  Transportation Needs: Not on file  Physical Activity: Not on file  Stress: Not on file  Social Connections: Not on file    Family History: No family history on file.  Allergies: Allergies  Allergen Reactions   Penicillins Rash    Has patient had a PCN reaction causing immediate rash, facial/tongue/throat swelling, SOB or lightheadedness with hypotension: Yes Has patient had a PCN reaction causing severe rash involving mucus membranes or skin necrosis: No Has patient had a PCN reaction that required hospitalization: No Has patient had a PCN reaction occurring within the last 10 years:  No If all of the above answers are "NO", then may proceed with Cephalosporin use.    Medications Prior to Admission  Medication Sig Dispense Refill Last Dose   Prenatal Vit-Fe Fumarate-FA (PRENATAL MULTIVITAMIN) TABS tablet Take 1 tablet by mouth at bedtime.   08/23/2021    Review of Systems   Constitutional: Negative for fever and chills Eyes: Negative for visual disturbances Respiratory: Negative for shortness of breath, dyspnea Cardiovascular: Negative for chest pain or palpitations  Gastrointestinal: Negative for vomiting, diarrhea and constipation.  POSITIVE for abdominal pain (contractions) Genitourinary: Negative for dysuria and urgency Musculoskeletal: Negative for back pain, joint pain, myalgias  Neurological: Negative for dizziness and headaches  Blood pressure 131/77, pulse 78, temperature 98.5 F (36.9 C), temperature source Oral, resp. rate 18, height 5\' 6"  (1.676 m), weight 84.8 kg, unknown if currently breastfeeding. General appearance: alert, cooperative, and no distress Lungs: normal respiratory effort Heart: regular rate and rhythm Abdomen: soft, non-tender; bowel sounds normal Extremities: Homans sign is negative, no sign of DVT DTR's 2+ Presentation: cephalic Fetal monitoring  Baseline: 140 bpm, Variability: Good {> 6 bpm), Accelerations: Reactive, and Decelerations: Absent Uterine activity  2-3 minutes Dilation: 7.5  Effacement (%): 70 Station: -2 Exam by:: Joline Salt, RN   Prenatal labs: ABO, Rh: --/--/PENDING (08/22 0041) Antibody: PENDING (08/22 0041) Rubella:   RPR:    HBsAg:    HIV:    GBS:    1 hr Glucola 120 Genetic screening  AFP neg Anatomy US normal female  Prenatal Transfer Tool  Maternal Diabetes: No Genetic Screening: Normal Maternal Ultrasounds/Referrals: Normal Fetal Ultrasounds or other Referrals:  None Maternal Substance Abuse:  No Significant Maternal Medications:  None Significant Maternal Lab Results: Group B Strep  positive  Results for orders placed or performed during the hospital encounter of 08/23/21 (from the past 24 hour(s))  POCT fern test   Collection Time: 08/24/21 12:32 AM  Result Value Ref Range   POCT Fern Test Positive = ruptured amniotic membanes   CBC   Collection Time: 08/24/21 12:41 AM  Result Value Ref Range   WBC 8.0 4.0 - 10.5 K/uL   RBC 4.75 3.87 - 5.11 MIL/uL   Hemoglobin 13.0 12.0 - 15.0 g/dL   HCT 18.2 99.3 - 71.6 %   MCV 81.5 80.0 - 100.0 fL   MCH 27.4 26.0 - 34.0 pg   MCHC 33.6 30.0 - 36.0 g/dL   RDW 96.7 89.3 - 81.0 %   Platelets 201 150 - 400 K/uL   nRBC 0.0 0.0 - 0.2 %  Type and screen [facility]   Collection Time: 08/24/21 12:41 AM  Result Value Ref Range   ABO/RH(D) PENDING    Antibody Screen PENDING    Sample Expiration      08/27/2021,2359 Performed at [facility], [address], [place], [place] [address]     Assessment: Jeanine Caven is a 36 y.o. [phone] with an IUP at [redacted]w[redacted]d presenting for active labor  Plan: #Labor: expectant management #Pain:  Per request #FWB Cat 1 #ID: GBS: Clindamycin  #MOF:  breast and bottle #MOC: depo  #Circ: yes   Jacklyn Shell 08/24/2021, 1:13 AM

## 2021-08-24 NOTE — Discharge Summary (Signed)
Postpartum Discharge Summary  Date of Service updated***     Patient Name: Lindsey Gilbert DOB: Nov 09, 1985 MRN: [id number]  Date of admission: 08/23/2021 Delivery date:08/24/2021  Delivering provider: Christin Fudge  Date of discharge: 08/24/2021  Admitting diagnosis: Indication for care in labor or delivery [O75.9] Intrauterine pregnancy: [redacted]w[redacted]d    Secondary diagnosis:  Principal Problem:   Indication for care in labor or delivery  Additional problems: Rh neg   Discharge diagnosis: {DX.:23714}                                              Post partum procedures:{Postpartum procedures:23558} Augmentation: none Complications: None  Hospital course: Onset of Labor With Vaginal Delivery      36y.o. yo [id number] 37w0das admitted in Active Labor on 08/23/2021. Patient had an uncomplicated labor course as follows:  Membrane Rupture Time/Date: 11:00 PM ,08/23/2021   Delivery Method:Vaginal, Spontaneous  Episiotomy: None  Lacerations:  1st degree  Patient had an uncomplicated postpartum course.  She is ambulating, tolerating a regular diet, passing flatus, and urinating well. Patient is discharged home in stable condition on 08/24/21.  Newborn Data: Birth date:08/24/2021  Birth time:1:59 AM  Gender:Female  Living status:Living  Apgars: ,  Weight:   Magnesium Sulfate received: No BMZ received: No Rhophylac:{Rhophylac received:30440032} MMR:N/A T-DaP:{Tdap:23962} Flu: {F{WUG:89169}ransfusion:{Transfusion received:30440034}  Physical exam  Vitals:   08/24/21 0014 08/24/21 0121  BP: 131/77 127/80  Pulse: 78 83  Resp: 18 20  Temp: 98.5 F (36.9 C)   TempSrc: Oral   Weight: 84.8 kg   Height: _0  (1.676 m)    General: {Exam; general:21111117} Lochia: {Desc; appropriate/inappropriate:30686::"appropriate"} Uterine Fundus: {Desc; firm/soft:30687} Incision: {Exam; incision:21111123} DVT Evaluation: {Exam; dvIHW:3888280}abs: Lab Results  Component Value Date    WBC 8.0 08/24/2021   HGB 13.0 08/24/2021   HCT 38.7 08/24/2021   MCV 81.5 08/24/2021   PLT 201 08/24/2021      Latest Ref Rng & Units 10/20/2017    9:57 PM  CMP  Glucose 70 - 99 mg/dL 88   BUN 6 - 20 mg/dL 7   Creatinine 0.44 - 1.00 mg/dL 0.52   Sodium 135 - 145 mmol/L 134   Potassium 3.5 - 5.1 mmol/L 3.7   Chloride 98 - 111 mmol/L 104   CO2 22 - 32 mmol/L 23   Calcium 8.9 - 10.3 mg/dL 8.4   Total Protein 6.5 - 8.1 g/dL 6.5   Total Bilirubin 0.3 - 1.2 mg/dL 0.3   Alkaline Phos 38 - 126 U/L 81   AST 15 - 41 U/L 28   ALT 0 - 44 U/L 32    Edinburgh Score:    11/15/2017    3:19 PM  Edinburgh Postnatal Depression Scale Screening Tool  I have been able to laugh and see the funny side of things. 0  I have looked forward with enjoyment to things. 0  I have blamed myself unnecessarily when things went wrong. 0  I have been anxious or worried for no good reason. 0  I have felt scared or panicky for no good reason. 0  Things have been getting on top of me. 0  I have been so unhappy that I have had difficulty sleeping. 1  I have felt sad or miserable. 0  I have been so unhappy that I have been crying.  0  The thought of harming myself has occurred to me. 0  Edinburgh Postnatal Depression Scale Total 1     After visit meds:  Allergies as of 08/24/2021       Reactions   Penicillins Rash   Has patient had a PCN reaction causing immediate rash, facial/tongue/throat swelling, SOB or lightheadedness with hypotension: Yes Has patient had a PCN reaction causing severe rash involving mucus membranes or skin necrosis: No Has patient had a PCN reaction that required hospitalization: No Has patient had a PCN reaction occurring within the last 10 years: No If all of the above answers are "NO", then may proceed with Cephalosporin use.     Med Rec must be completed prior to using this [facility]***        Discharge home in stable condition Infant Feeding: {Baby  feeding:23562} Infant Disposition:{CHL IP OB HOME WITH KHVFMB:34037} Discharge instruction: per After Visit Summary and Postpartum booklet. Activity: Advance as tolerated. Pelvic rest for 6 weeks.  Diet: {OB QDUK:38381840} Future Appointments:No future appointments. Follow up Visit:     08/24/2021 Christin Fudge, CNM

## 2021-08-24 NOTE — Lactation Note (Signed)
This note was copied from a baby's chart. Lactation Consultation Note  Patient Name: Lindsey Gilbert HKFEX'M Date: 08/24/2021 Reason for consult: Initial assessment;Term;Breastfeeding assistance (per [facility] - mom speaks English. Per Birth parent - baby recently fed 10 mins and 15 ml of formula. Bbaby awake. LC offered to see if the baby would latch. Baby opens wide and latched with a few sucks. STS w/ Birth parent.) Age:25 hours LC reviewed supply and demand , importance of giving the baby the opportunity to go to the breast. 1st and if still hungry after the 1st breast, offer the 2nd , if satisfied, hold off on supplementing, if not keep the volume low.  LC encouraged mom to call with feeding cues.  Maternal Data Has patient been taught Hand Expression?: Yes Does the patient have breastfeeding experience prior to this delivery?: Yes How long did the patient breastfeed?: per birth parent - BF per other 5 - 8 - 10 months  Feeding Mother's Current Feeding Choice: Breast Milk and Formula Nipple Type: Slow - flow  LATCH Score Latch: Repeated attempts needed to sustain latch, nipple held in mouth throughout feeding, stimulation needed to elicit sucking reflex.  Audible Swallowing: None  Type of Nipple: Everted at rest and after stimulation  Comfort (Breast/Nipple): Soft / non-tender  Hold (Positioning): Assistance needed to correctly position infant at breast and maintain latch.  LATCH Score: 6   Lactation Tools Discussed/Used    Interventions Interventions: Breast feeding basics reviewed;Assisted with latch;Skin to skin;Breast massage;Hand express;Reverse pressure;Breast compression;Adjust position;Support pillows;Position options;Education;LC Services brochure  Discharge Pump: DEBP;Personal (per Birth parent - thinking its a DEBP - [employer]) WIC Program: No  Consult Status Consult Status: Follow-up Date: 08/24/21 Follow-up type: In-patient    Matilde Sprang Marvelene Stoneberg 08/24/2021,  9:08 AM

## 2021-08-25 ENCOUNTER — Encounter: Payer: Self-pay | Admitting: Obstetrics and Gynecology

## 2021-08-25 DIAGNOSIS — Z6791 Unspecified blood type, Rh negative: Secondary | ICD-10-CM | POA: Insufficient documentation

## 2021-08-25 DIAGNOSIS — O26899 Other specified pregnancy related conditions, unspecified trimester: Secondary | ICD-10-CM | POA: Insufficient documentation

## 2021-08-25 LAB — RH IG WORKUP (INCLUDES ABO/RH)
Fetal Screen: NEGATIVE
Gestational Age(Wks): 40
Unit division: 0

## 2021-08-25 MED ORDER — IBUPROFEN 600 MG PO TABS
600.0000 mg | ORAL_TABLET | Freq: Four times a day (QID) | ORAL | 0 refills | Status: AC
Start: 2021-08-25 — End: ?

## 2021-08-25 MED ORDER — ACETAMINOPHEN 325 MG PO TABS: 650.0000 mg | ORAL_TABLET | ORAL | 0 refills | Status: AC | PRN

## 2021-08-25 MED ORDER — FERROUS SULFATE 325 (65 FE) MG PO TABS: 325.0000 mg | ORAL_TABLET | ORAL | 0 refills | Status: AC

## 2021-08-25 NOTE — Progress Notes (Addendum)
POSTPARTUM PROGRESS NOTE  Post Partum Day 1  Subjective:  Lindsey Gilbert is a 36 y.o. [id number] s/p SVD at 110w0d.  No acute events overnight.  Pt denies problems with ambulating, voiding or po intake.  She denies nausea or vomiting.  Pain is well controlled.  She has had flatus. She has not had bowel movement.  Lochia Minimal.   Objective: Blood pressure 109/70, pulse 76, temperature 98 F (36.7 C), temperature source Oral, resp. rate 18, height 5\' 6"  (1.676 m), weight 84.8 kg, SpO2 100 %, unknown if currently breastfeeding.  Physical Exam:  General: alert, cooperative and no distress Chest: no respiratory distress Heart:regular rate, distal pulses intact Abdomen: soft, nontender,  Uterine Fundus: firm, appropriately tender DVT Evaluation: No calf swelling or tenderness Extremities: no edema Skin: warm, dry;  Recent Labs    08/24/21 0041  HGB 13.0  HCT 38.7    Assessment/Plan: Lindsey Gilbert is a 36 y.o. 31 s/p SVD at [redacted]w[redacted]d   PPD#1 - Doing well Contraception: depo Feeding: breast and bottle Dispo: Plan for discharge today.   LOS: 1 day   [redacted]w[redacted]d, Medical Student 08/25/2021, 7:39 AM    I was personally present and re-performed the exam and MDM and verified the service and findings are accurately documented in the student's note. Indiana Pechacek Autry-Lott, DO 08/25/21 9:48 AM

## 2021-08-26 LAB — SURGICAL PATHOLOGY

## 2021-09-01 ENCOUNTER — Telehealth (HOSPITAL_COMMUNITY): Payer: Self-pay | Admitting: *Deleted

## 2021-09-01 NOTE — Telephone Encounter (Signed)
Pt husband reports mom is feeling good. No concerns about herself at this time. EPDS not completed. [facility] score=0) Pt husband reports baby is doing well. Feeding, peeing, and pooping without difficulty. Safe sleep reviewed. No concerns about baby at present.  Duffy Rhody, RN 09-01-2021 at 1:51pm
